# Patient Record
Sex: Female | Born: 1975 | Race: Black or African American | Hispanic: No | Smoking: Current every day smoker
Health system: Southern US, Community
[De-identification: ages and names within clinical notes are randomized; demographics above are authoritative.]

## PROBLEM LIST (undated history)

## (undated) HISTORY — PX: APPENDECTOMY: SHX54

---

## 2002-01-04 ENCOUNTER — Emergency Department (HOSPITAL_COMMUNITY): Admission: EM | Admit: 2002-01-04 | Discharge: 2002-01-04 | Payer: Self-pay | Admitting: Emergency Medicine

## 2002-10-23 ENCOUNTER — Emergency Department (HOSPITAL_COMMUNITY): Admission: EM | Admit: 2002-10-23 | Discharge: 2002-10-23 | Payer: Self-pay | Admitting: Emergency Medicine

## 2002-11-14 ENCOUNTER — Emergency Department (HOSPITAL_COMMUNITY): Admission: EM | Admit: 2002-11-14 | Discharge: 2002-11-14 | Payer: Self-pay | Admitting: Emergency Medicine

## 2002-11-14 ENCOUNTER — Encounter: Payer: Self-pay | Admitting: Emergency Medicine

## 2003-09-16 ENCOUNTER — Emergency Department (HOSPITAL_COMMUNITY): Admission: EM | Admit: 2003-09-16 | Discharge: 2003-09-16 | Payer: Self-pay | Admitting: *Deleted

## 2004-08-28 ENCOUNTER — Emergency Department (HOSPITAL_COMMUNITY): Admission: EM | Admit: 2004-08-28 | Discharge: 2004-08-28 | Payer: Self-pay | Admitting: Emergency Medicine

## 2004-12-06 ENCOUNTER — Emergency Department (HOSPITAL_COMMUNITY): Admission: EM | Admit: 2004-12-06 | Discharge: 2004-12-06 | Payer: Self-pay | Admitting: *Deleted

## 2005-02-04 ENCOUNTER — Emergency Department (HOSPITAL_COMMUNITY): Admission: EM | Admit: 2005-02-04 | Discharge: 2005-02-04 | Payer: Self-pay | Admitting: Emergency Medicine

## 2006-04-06 ENCOUNTER — Emergency Department (HOSPITAL_COMMUNITY): Admission: EM | Admit: 2006-04-06 | Discharge: 2006-04-06 | Payer: Self-pay | Admitting: Emergency Medicine

## 2006-08-05 ENCOUNTER — Emergency Department (HOSPITAL_COMMUNITY): Admission: EM | Admit: 2006-08-05 | Discharge: 2006-08-05 | Payer: Self-pay | Admitting: Emergency Medicine

## 2008-10-11 ENCOUNTER — Inpatient Hospital Stay (HOSPITAL_COMMUNITY): Admission: AD | Admit: 2008-10-11 | Discharge: 2008-10-11 | Payer: Self-pay | Admitting: Family Medicine

## 2008-11-08 ENCOUNTER — Encounter: Payer: Self-pay | Admitting: Obstetrics & Gynecology

## 2008-11-08 ENCOUNTER — Ambulatory Visit: Payer: Self-pay | Admitting: Obstetrics & Gynecology

## 2008-11-09 ENCOUNTER — Encounter: Payer: Self-pay | Admitting: Obstetrics & Gynecology

## 2008-11-22 ENCOUNTER — Ambulatory Visit: Payer: Self-pay | Admitting: Obstetrics & Gynecology

## 2008-12-19 ENCOUNTER — Ambulatory Visit (HOSPITAL_COMMUNITY): Admission: RE | Admit: 2008-12-19 | Discharge: 2008-12-19 | Payer: Self-pay | Admitting: Obstetrics and Gynecology

## 2009-03-16 ENCOUNTER — Emergency Department (HOSPITAL_COMMUNITY): Admission: EM | Admit: 2009-03-16 | Discharge: 2009-03-16 | Payer: Self-pay | Admitting: Emergency Medicine

## 2009-05-08 ENCOUNTER — Emergency Department (HOSPITAL_COMMUNITY): Admission: EM | Admit: 2009-05-08 | Discharge: 2009-05-08 | Payer: Self-pay | Admitting: Emergency Medicine

## 2009-05-12 ENCOUNTER — Emergency Department (HOSPITAL_COMMUNITY): Admission: EM | Admit: 2009-05-12 | Discharge: 2009-05-12 | Payer: Self-pay | Admitting: Emergency Medicine

## 2009-05-12 ENCOUNTER — Emergency Department (HOSPITAL_COMMUNITY): Admission: EM | Admit: 2009-05-12 | Discharge: 2009-05-12 | Payer: Self-pay | Admitting: Family Medicine

## 2009-06-29 ENCOUNTER — Inpatient Hospital Stay (HOSPITAL_COMMUNITY): Admission: EM | Admit: 2009-06-29 | Discharge: 2009-07-01 | Payer: Self-pay | Admitting: Emergency Medicine

## 2009-06-29 ENCOUNTER — Encounter (INDEPENDENT_AMBULATORY_CARE_PROVIDER_SITE_OTHER): Payer: Self-pay | Admitting: General Surgery

## 2009-07-24 ENCOUNTER — Emergency Department (HOSPITAL_COMMUNITY): Admission: EM | Admit: 2009-07-24 | Discharge: 2009-07-25 | Payer: Self-pay | Admitting: Emergency Medicine

## 2010-05-26 ENCOUNTER — Emergency Department (HOSPITAL_COMMUNITY): Admission: EM | Admit: 2010-05-26 | Discharge: 2010-05-26 | Payer: Self-pay | Admitting: Emergency Medicine

## 2010-10-22 ENCOUNTER — Inpatient Hospital Stay (HOSPITAL_COMMUNITY): Admission: AD | Admit: 2010-10-22 | Discharge: 2010-10-22 | Payer: Self-pay | Admitting: Obstetrics & Gynecology

## 2010-11-10 ENCOUNTER — Emergency Department (HOSPITAL_COMMUNITY)
Admission: EM | Admit: 2010-11-10 | Discharge: 2010-11-10 | Payer: Self-pay | Source: Home / Self Care | Admitting: Emergency Medicine

## 2010-12-15 ENCOUNTER — Encounter: Payer: Self-pay | Admitting: *Deleted

## 2011-02-04 LAB — POCT PREGNANCY, URINE: Preg Test, Ur: NEGATIVE

## 2011-02-04 LAB — URINALYSIS, ROUTINE W REFLEX MICROSCOPIC
Glucose, UA: NEGATIVE mg/dL
Hgb urine dipstick: NEGATIVE
Ketones, ur: NEGATIVE mg/dL
pH: 6.5 (ref 5.0–8.0)

## 2011-02-04 LAB — GC/CHLAMYDIA PROBE AMP, GENITAL: Chlamydia, DNA Probe: NEGATIVE

## 2011-02-04 LAB — WET PREP, GENITAL: Yeast Wet Prep HPF POC: NONE SEEN

## 2011-03-01 LAB — POCT I-STAT, CHEM 8
Calcium, Ion: 1.17 mmol/L (ref 1.12–1.32)
Glucose, Bld: 107 mg/dL — ABNORMAL HIGH (ref 70–99)
HCT: 50 % — ABNORMAL HIGH (ref 36.0–46.0)
Hemoglobin: 17 g/dL — ABNORMAL HIGH (ref 12.0–15.0)

## 2011-03-01 LAB — DIFFERENTIAL
Basophils Absolute: 0 10*3/uL (ref 0.0–0.1)
Basophils Absolute: 0 10*3/uL (ref 0.0–0.1)
Basophils Relative: 0 % (ref 0–1)
Basophils Relative: 0 % (ref 0–1)
Eosinophils Absolute: 0.8 K/uL — ABNORMAL HIGH (ref 0.0–0.7)
Eosinophils Relative: 9 % — ABNORMAL HIGH (ref 0–5)
Eosinophils Relative: 0 % (ref 0–5)
Lymphocytes Relative: 36 % (ref 12–46)
Lymphs Abs: 3 10*3/uL (ref 0.7–4.0)
Monocytes Absolute: 0.7 10*3/uL (ref 0.1–1.0)
Monocytes Absolute: 0.4 10*3/uL (ref 0.1–1.0)
Monocytes Relative: 8 % (ref 3–12)
Neutro Abs: 3.9 10*3/uL (ref 1.7–7.7)
Neutro Abs: 11 10*3/uL — ABNORMAL HIGH (ref 1.7–7.7)
Neutrophils Relative %: 47 % (ref 43–77)

## 2011-03-01 LAB — COMPREHENSIVE METABOLIC PANEL WITH GFR
ALT: 19 U/L (ref 0–35)
AST: 37 U/L (ref 0–37)
Alkaline Phosphatase: 91 U/L (ref 39–117)
CO2: 27 meq/L (ref 19–32)
Chloride: 105 meq/L (ref 96–112)
GFR calc non Af Amer: >60 mL/min (ref >60)
Glucose, Bld: 94 mg/dL (ref 70–99)
Potassium: 3.7 meq/L (ref 3.5–5.1)
Sodium: 136 meq/L (ref 135–145)
Total Bilirubin: 0.4 mg/dL (ref 0.3–1.2)

## 2011-03-01 LAB — COMPREHENSIVE METABOLIC PANEL
AST: 54 U/L — ABNORMAL HIGH (ref 0–37)
Albumin: 3.8 g/dL (ref 3.5–5.2)
Albumin: 4.1 g/dL (ref 3.5–5.2)
Alkaline Phosphatase: 83 U/L (ref 39–117)
BUN: 6 mg/dL (ref 6–23)
BUN: 5 mg/dL — ABNORMAL LOW (ref 6–23)
Calcium: 8.9 mg/dL (ref 8.4–10.5)
Chloride: 103 mEq/L (ref 96–112)
Creatinine, Ser: 0.73 mg/dL (ref 0.4–1.2)
GFR calc Af Amer: >60 mL/min (ref >60)
Potassium: 3.8 mEq/L (ref 3.5–5.1)
Total Bilirubin: 0.6 mg/dL (ref 0.3–1.2)
Total Protein: 7.5 g/dL (ref 6.0–8.3)

## 2011-03-01 LAB — CBC
HCT: 42.5 % (ref 36.0–46.0)
HCT: 46.5 % — ABNORMAL HIGH (ref 36.0–46.0)
Hemoglobin: 14.5 g/dL (ref 12.0–15.0)
MCHC: 34.1 g/dL (ref 30.0–36.0)
MCV: 91.2 fL (ref 78.0–100.0)
Platelets: 184 10*3/uL (ref 150–400)
Platelets: 182 10*3/uL (ref 150–400)
RBC: 4.66 MIL/uL (ref 3.87–5.11)
RBC: 5.06 MIL/uL (ref 3.87–5.11)
RDW: 12.7 % (ref 11.5–15.5)
WBC: 8.4 K/uL (ref 4.0–10.5)
WBC: 12.3 10*3/uL — ABNORMAL HIGH (ref 4.0–10.5)

## 2011-03-01 LAB — URINALYSIS, ROUTINE W REFLEX MICROSCOPIC
Bilirubin Urine: NEGATIVE
Bilirubin Urine: NEGATIVE
Bilirubin Urine: NEGATIVE
Glucose, UA: NEGATIVE mg/dL
Hgb urine dipstick: NEGATIVE
Ketones, ur: NEGATIVE mg/dL
Ketones, ur: 15 mg/dL — AB
Nitrite: NEGATIVE
Nitrite: NEGATIVE
Nitrite: NEGATIVE
Protein, ur: NEGATIVE mg/dL
Specific Gravity, Urine: 1.025 (ref 1.005–1.030)
Specific Gravity, Urine: 1.01 (ref 1.005–1.030)
Urobilinogen, UA: 0.2 mg/dL (ref 0.0–1.0)
Urobilinogen, UA: 0.2 mg/dL (ref 0.0–1.0)
pH: 6 (ref 5.0–8.0)
pH: 7.5 (ref 5.0–8.0)

## 2011-03-01 LAB — CULTURE, BLOOD (ROUTINE X 2)

## 2011-03-01 LAB — URINE CULTURE

## 2011-03-01 LAB — LIPASE, BLOOD: Lipase: 26 U/L (ref 11–59)

## 2011-03-01 LAB — PREGNANCY, URINE: Preg Test, Ur: NEGATIVE

## 2011-03-05 LAB — COMPREHENSIVE METABOLIC PANEL
ALT: 20 U/L (ref 0–35)
AST: 38 U/L — ABNORMAL HIGH (ref 0–37)
Alkaline Phosphatase: 76 U/L (ref 39–117)
CO2: 29 mEq/L (ref 19–32)
Calcium: 9.3 mg/dL (ref 8.4–10.5)
Chloride: 105 mEq/L (ref 96–112)
GFR calc non Af Amer: >60 mL/min (ref >60)
Glucose, Bld: 105 mg/dL — ABNORMAL HIGH (ref 70–99)
Potassium: 4.1 mEq/L (ref 3.5–5.1)
Sodium: 139 mEq/L (ref 135–145)
Total Bilirubin: 0.5 mg/dL (ref 0.3–1.2)

## 2011-03-05 LAB — DIFFERENTIAL
Basophils Absolute: 0.1 10*3/uL (ref 0.0–0.1)
Basophils Relative: 1 % (ref 0–1)
Eosinophils Absolute: 0.1 10*3/uL (ref 0.0–0.7)
Eosinophils Relative: 2 % (ref 0–5)
Neutrophils Relative %: 65 % (ref 43–77)

## 2011-03-05 LAB — URINALYSIS, ROUTINE W REFLEX MICROSCOPIC
Bilirubin Urine: NEGATIVE
Glucose, UA: NEGATIVE mg/dL
Hgb urine dipstick: NEGATIVE
Protein, ur: NEGATIVE mg/dL
Urobilinogen, UA: 0.2 mg/dL (ref 0.0–1.0)

## 2011-03-05 LAB — LIPASE, BLOOD: Lipase: 20 U/L (ref 11–59)

## 2011-03-05 LAB — CBC
Hemoglobin: 15.8 g/dL — ABNORMAL HIGH (ref 12.0–15.0)
MCHC: 34.6 g/dL (ref 30.0–36.0)
RBC: 5.02 MIL/uL (ref 3.87–5.11)
WBC: 8.8 10*3/uL (ref 4.0–10.5)

## 2011-04-08 NOTE — Discharge Summary (Signed)
NAMEALEXEA, BLASE NO.:  [id number]   MEDICAL RECORD NO.:  [id number]          PATIENT TYPE:  INP   LOCATION:  5121                         FACILITY:  [facility]   PHYSICIAN:  Gaynelle Adu, MD        DATE OF BIRTH:  27-Aug-1976   DATE OF ADMISSION:  06/28/2009  DATE OF DISCHARGE:  07/01/2009                               DISCHARGE SUMMARY   DISCHARGING PHYSICIAN:  Gaynelle Adu, MD   ADMITTING AND OPERATING PHYSICIAN:  Juanetta Gosling, MD   HISTORY OF PRESENT ILLNESS:  Ms. Jeffrey is a pleasant 35 year old female  who was brought in with pain in periumbilical region with some nausea  and vomiting as well as loose stools.  She was evaluated in the  emergency department by Dr. Dwain Sarna and found to have an elevated  white blood cell count of 12.3 with a left shift.  She had a CT scan  that shows dilatation of her appendix with some periappendiceal  stranding.  Decision was made at that point by Dr. Dwain Sarna to proceed  with admission and surgical intervention.   SUMMARY OF HOSPITAL COURSE:  The patient was admitted on June 29, 2009,  was taken to the operating room same evening, underwent laparoscopic  appendectomy without complication.  She tolerated this procedure very  well.  She was admitted to the floor in stable condition.  Postoperatively, the patient had essentially normal course without  postoperative complication including fever, chest pain, shortness of  breath, wound infection, or DVT.  She did not have any repeat labs and  as of July 01, 2009, the patient was afebrile, passing gas, eating  well, and complaining of minimal pain.  Decision at that point was made  to discharge the patient as she was stable.   DISCHARGE DIAGNOSIS:  Acute appendicitis, status post laparoscopic  appendectomy.   PLAN:  The patient will follow up as directed in the office for recheck.  She will be given a prescription for narcotic analgesic to use as  directed in  addition to p.r.n. Tylenol or ibuprofen.   ACTIVITY:  Work restrictions that had been given to her and call our  office with any significant complaints.      Brayton El, PA-C      Gaynelle Adu, MD  Electronically Signed    KB/MEDQ  D:  07/11/2009  T:  07/12/2009  Job:  [id number]

## 2011-04-08 NOTE — Op Note (Signed)
[name], [name] NO.:  [id number]   MEDICAL RECORD NO.:  [id number]          PATIENT TYPE:  INP   LOCATION:  5121                         FACILITY:  [facility]   PHYSICIAN:  Juanetta Gosling, MDDATE OF BIRTH:  [birth year]   DATE OF PROCEDURE:  06/29/2009  DATE OF DISCHARGE:                               OPERATIVE REPORT   PREOPERATIVE DIAGNOSIS:  Appendicitis.   POSTOPERATIVE DIAGNOSIS:  Acute appendicitis.   PROCEDURE:  Laparoscopic appendectomy.   SURGEON:  Juanetta Gosling, MD   ASSISTANT:  None.   ANESTHESIA:  General.   SUPERVISING ANESTHESIOLOGIST:  Burna Forts, MD   SPECIMEN:  Appendix to Pathology.   ESTIMATED BLOOD LOSS:  75 mL.   COMPLICATIONS:  None.   DRAINS:  None.   DISPOSITION:  To recovery room in stable condition.   INDICATIONS:  [name] is a 35 year old female with about 24 hours of  right lower quadrant periumbilical suprapubic pain that has worsened  over that time.  Her white blood cell count was elevated at 12.3 with a  left shift.  She underwent a CT scan that showed dilated appendix with  some periappendiceal stranding.  I counseled her for a laparoscopic  possible open appendectomy.   PROCEDURE:  After informed consent was obtained, the patient was first  administered 400 mg of IV ciprofloxacin and 500 mg of IV metronidazole  due to her penicillin allergy.  She was then brought to the operating  room and sequential compression devices were placed on lower extremities  prior to operation.  She then underwent general endotracheal anesthesia  without complication.  Her arms were then tucked and appropriately  padded.  Her abdomen was prepped with ChloraPrep, 3 minutes were allowed  to pass.  She was then draped in a standard sterile surgical fashion.  Surgical time-out was then performed.   A 10-mm vertical incision was then made below her umbilicus and  dissection was carried out down to the level of the  fascia.  A Kocher  clamp was used to grasp the base of the umbilicus and the fascia was  entered sharply.  The peritoneum was then entered bluntly.  A 0-Vicryl  pursestring suture was then placed through the fascia.  A Hasson trocar  was then introduced into the abdomen and the abdomen was insufflated to  15 mmHg.  Two further 5-mm ports were placed under direct vision after  infiltration of local anesthetic without complication in the suprapubic  region as well as in the right upper quadrant.  She was then placed in  the Trendelenburg position and the appendix was approached. There were  some adhesions from her omentum to her right lower quadrant that were  taken down bluntly.  These were not bleeding upon completion.  Her  appendix was then noted to have acute suppurative appendicitis that was  thick and inflamed at the distal two thirds, this was then grasped, the  base was identified and encircled with a Art gallery manager.  Following  this, I used the GIA stapler with a white load to divide the mesentery.  The mesentery was thickened and there was pulsatile bleeding from this  edge upon completion of this.  I was able to identify this and I placed  three 10-mm clips on this base of the mesentery following that.  This  was hemostatic upon completion.  I then used a blue load and divided the  appendix at the base of the cecum.  This was looked good upon  completion.  The appendix was then placed in an EndoCatch and then this  was removed.  Following this, I reinspected the site where I had the  bleeding from the mesentery and this appeared clean.  Irrigation was  performed.  This was cleared.  I then tied down my umbilical suture  after removing the trocar and this appeared to be a good closure.  I did  although place an additional 0-Vicryl stitch through the fascia.  This  appeared to be well closed upon completion.  I then removed the camera,  desufflated the abdomen, and removed the  remaining trocars.  The wound  were then closed with a 4-0 Monocryl in a subcuticular fashion.  Dermabond was placed over these wounds.  She tolerated this well.  Foley  was removed in the operating room, she was extubated and transferred to  recovery room in stable condition.      Juanetta Gosling, MD  Electronically Signed     MCW/MEDQ  D:  06/29/2009  T:  06/29/2009  Job:  [id number]

## 2011-04-08 NOTE — H&P (Signed)
[name], [name] NO.:  [id number]   MEDICAL RECORD NO.:  [id number]          PATIENT TYPE:  INP   LOCATION:  5121                         FACILITY:  [facility]   PHYSICIAN:  Juanetta Gosling, MDDATE OF BIRTH:  02/27/1976   DATE OF ADMISSION:  06/28/2009  DATE OF DISCHARGE:                              HISTORY & PHYSICAL   CHIEF COMPLAINT:  Abdominal pain, nausea, vomiting, and loose stools.   HISTORY:  This is a 35 year old female, who is otherwise fairly healthy,  who right about in 24 hours prior to my evaluation of her, began having  abdominal pain in her periumbilical region associated with some bilious  emesis as well as a couple of loose stools.  These were nonbloody.  This  pain progressed during the day yesterday resulting in her coming to the  emergency room.  She denies any fevers at home.  The pain is primarily  located in the periumbilical right lower quadrant suprapubic region and  she says that this is worse with moving.  It persists despite the pain  medication she has received in the emergency room.  She has had no  further nausea, vomiting, or any loose stools since she has arrived in  the emergency room.  She denies any fevers associated with this and  denies any prior history of this.  She does have over the last year or  so, some heartburn that she has been treated for and has had some  bloating, but this is different than those more chronic symptoms.   PAST MEDICAL HISTORY:  Significant for reactive airway disease.   PAST SURGICAL HISTORY:  Positive for oral surgery.   SOCIAL HISTORY:  She takes occasional alcohol and does smoke.   DRUG ALLERGIES:  PENICILLIN.   MEDICATIONS:  Albuterol p.r.n., she has only used 3 times this year.   REVIEW OF SYSTEMS:  Otherwise negative.   PHYSICAL EXAMINATION:  VITAL SIGNS:  Temperature 98.7, pulse 77,  respirations 24, and blood pressure 147/89.  GENERAL:  She is an obese female in no apparent  distress.  NECK:  Without lymphadenopathy.  Sclerae anicteric.  HEART:  Regular rate and rhythm.  LUNGS:  Clear bilaterally.  ABDOMEN:  Obese.  She is tender to palpation in the periumbilical right  lower quadrant suprapubic region without any real peritoneal signs.  Bowel sounds are present.  EXTREMITIES:  No edema.   LABORATORY EVALUATION:  UA negative for infection and hCG that is  negative.  Lipase 21.  Her liver function tests are within normal  limits.  Sodium is 137.  Potassium 3.8, BUN 5, creatinine 0.77, and  glucose 111.  White blood cell count is 12.3 with a left shift and 89  segs.  Hematocrit 46.5 and platelets 182.  She has a CT scan that shows  mild dilation of her appendix with some periappendiceal stranding.  No  evidence of perforation and a left adnexal cyst that will need to be  followed with an ultrasound.   IMPRESSION:  Appendicitis.   PLAN OF ACTION:  1. IV fluids.  2. Cipro,  Flagyl due to her penicillin allergy.  3. N.p.o.  4. I discussed a laparoscopic possible open appendectomy with her and      I would like to do this tonight.  She is tearful upon me telling      her that we discussed the risks and indications of this procedure      as she has stated that she is going to talk to her mother before we      proceed with the operation.  I will wait her decision to proceed      with this operation.  5. She will need a followup ultrasound for left adnexal cyst at some      point in the future as well.      Juanetta Gosling, MD  Electronically Signed     MCW/MEDQ  D:  06/29/2009  T:  06/29/2009  Job:  [id number]   cc:   Kennon Rounds, MD

## 2011-04-08 NOTE — Group Therapy Note (Signed)
NAMEMINDEL, FRISCIA NO.:  [id number]   MEDICAL RECORD NO.:  [id number]          PATIENT TYPE:  WOC   LOCATION:  [facility]                   FACILITY:  [facility]   PHYSICIAN:  Allie Bossier, MD        DATE OF BIRTH:  10-Aug-1976   DATE OF SERVICE:                                  CLINIC NOTE   [name] is a 35 year old gravida 0 who was seen in the MAU on November 18  for vaginal bleeding.  At that time her hemoglobin was 14.8 and she had  an ultrasound that showed a complex left ovarian cyst measuring about4 x  4.8 cm.  The endometrium was a normal thickness 4.4 mm, and otherwise appeared  normal.  She was told to follow up here.  She was given a prescription  of 10 mg of Provera to take for 14 days.  Today her complaint is that of  left lower quadrant pain.  She takes Motrin for it as necessary, and  some pelvic pressure, like she has to void (urinary frequency).  A urine  culture will be done.   PHYSICAL EXAM:  Cervix is nulliparous.  Her abdominal exam is  noncontributory because of her voluntary guarding.  Pap smear was  obtained.  To treat her ovarian cyst and pelvic pain, I will put her on  Low Ovral.  She will come back in 3 weeks for a blood pressure check.  An ultrasound will be scheduled in 6 weeks after initiation of birth  control pills to check the status of her ovarian cyst.      Allie Bossier, MD     MCD/MEDQ  D:  11/08/2008  T:  11/09/2008  Job:  [id number]

## 2011-07-21 ENCOUNTER — Emergency Department (HOSPITAL_COMMUNITY)
Admission: EM | Admit: 2011-07-21 | Discharge: 2011-07-21 | Disposition: A | Discharge status: Home / Self Care | Payer: Self-pay | Attending: Emergency Medicine | Admitting: Emergency Medicine

## 2011-07-21 DIAGNOSIS — IMO0002 Reserved for concepts with insufficient information to code with codable children: Secondary | ICD-10-CM | POA: Insufficient documentation

## 2011-07-23 ENCOUNTER — Emergency Department (HOSPITAL_COMMUNITY)
Admission: EM | Admit: 2011-07-23 | Discharge: 2011-07-23 | Disposition: A | Discharge status: Home / Self Care | Payer: Self-pay | Attending: Emergency Medicine | Admitting: Emergency Medicine

## 2011-07-23 DIAGNOSIS — Z09 Encounter for follow-up examination after completed treatment for conditions other than malignant neoplasm: Secondary | ICD-10-CM | POA: Insufficient documentation

## 2011-07-23 DIAGNOSIS — IMO0002 Reserved for concepts with insufficient information to code with codable children: Secondary | ICD-10-CM | POA: Insufficient documentation

## 2011-08-27 LAB — CBC
HCT: 43
Hemoglobin: 14.8
MCV: 92.3
Platelets: 218
RDW: 12.4

## 2011-08-27 LAB — WET PREP, GENITAL
Clue Cells Wet Prep HPF POC: NONE SEEN
WBC, Wet Prep HPF POC: NONE SEEN

## 2011-08-27 LAB — POCT PREGNANCY, URINE: Preg Test, Ur: NEGATIVE

## 2011-08-27 LAB — GC/CHLAMYDIA PROBE AMP, GENITAL: Chlamydia, DNA Probe: NEGATIVE

## 2011-08-29 LAB — POCT URINALYSIS DIP (DEVICE)
Bilirubin Urine: NEGATIVE
Glucose, UA: NEGATIVE mg/dL
Nitrite: NEGATIVE
Specific Gravity, Urine: 1.025 (ref 1.005–1.030)
Urobilinogen, UA: 1 mg/dL (ref 0.0–1.0)

## 2012-01-31 ENCOUNTER — Emergency Department (HOSPITAL_COMMUNITY)
Admission: EM | Admit: 2012-01-31 | Discharge: 2012-01-31 | Disposition: A | Discharge status: Home / Self Care | Payer: Self-pay | Attending: Emergency Medicine | Admitting: Emergency Medicine

## 2012-01-31 ENCOUNTER — Encounter (HOSPITAL_COMMUNITY): Payer: Self-pay | Admitting: Emergency Medicine

## 2012-01-31 ENCOUNTER — Emergency Department (HOSPITAL_COMMUNITY): Payer: Self-pay

## 2012-01-31 DIAGNOSIS — R059 Cough, unspecified: Secondary | ICD-10-CM | POA: Insufficient documentation

## 2012-01-31 DIAGNOSIS — R221 Localized swelling, mass and lump, neck: Secondary | ICD-10-CM | POA: Insufficient documentation

## 2012-01-31 DIAGNOSIS — R070 Pain in throat: Secondary | ICD-10-CM | POA: Insufficient documentation

## 2012-01-31 DIAGNOSIS — J111 Influenza due to unidentified influenza virus with other respiratory manifestations: Secondary | ICD-10-CM | POA: Insufficient documentation

## 2012-01-31 DIAGNOSIS — R51 Headache: Secondary | ICD-10-CM | POA: Insufficient documentation

## 2012-01-31 DIAGNOSIS — F17200 Nicotine dependence, unspecified, uncomplicated: Secondary | ICD-10-CM | POA: Insufficient documentation

## 2012-01-31 DIAGNOSIS — R0602 Shortness of breath: Secondary | ICD-10-CM | POA: Insufficient documentation

## 2012-01-31 DIAGNOSIS — J45909 Unspecified asthma, uncomplicated: Secondary | ICD-10-CM | POA: Insufficient documentation

## 2012-01-31 DIAGNOSIS — IMO0001 Reserved for inherently not codable concepts without codable children: Secondary | ICD-10-CM | POA: Insufficient documentation

## 2012-01-31 DIAGNOSIS — R509 Fever, unspecified: Secondary | ICD-10-CM | POA: Insufficient documentation

## 2012-01-31 DIAGNOSIS — R220 Localized swelling, mass and lump, head: Secondary | ICD-10-CM | POA: Insufficient documentation

## 2012-01-31 DIAGNOSIS — R05 Cough: Secondary | ICD-10-CM | POA: Insufficient documentation

## 2012-01-31 MED ORDER — OSELTAMIVIR PHOSPHATE 75 MG PO CAPS: 75.0000 mg | ORAL_CAPSULE | Freq: Two times a day (BID) | ORAL | Status: AC

## 2012-01-31 MED ORDER — IPRATROPIUM BROMIDE 0.02 % IN SOLN
0.5000 mg | Freq: Once | RESPIRATORY_TRACT | Status: AC
  Administered 2012-01-31: 0.5 mg via RESPIRATORY_TRACT
  Filled 2012-01-31: qty 2.5

## 2012-01-31 MED ORDER — ALBUTEROL SULFATE (5 MG/ML) 0.5% IN NEBU
5.0000 mg | INHALATION_SOLUTION | Freq: Once | RESPIRATORY_TRACT | Status: AC
  Administered 2012-01-31: 5 mg via RESPIRATORY_TRACT
  Filled 2012-01-31: qty 1

## 2012-01-31 MED ORDER — PREDNISONE 20 MG PO TABS: 40.0000 mg | ORAL_TABLET | Freq: Every day | ORAL | Status: AC

## 2012-01-31 MED ORDER — ACETAMINOPHEN 325 MG PO TABS
650.0000 mg | ORAL_TABLET | Freq: Once | ORAL | Status: AC
  Administered 2012-01-31: 650 mg via ORAL
  Filled 2012-01-31: qty 2

## 2012-01-31 MED ORDER — PREDNISONE 20 MG PO TABS
60.0000 mg | ORAL_TABLET | Freq: Once | ORAL | Status: AC
  Administered 2012-01-31: 60 mg via ORAL
  Filled 2012-01-31: qty 3

## 2012-01-31 MED ORDER — GI COCKTAIL ~~LOC~~
30.0000 mL | Freq: Once | ORAL | Status: AC
  Administered 2012-01-31: 30 mL via ORAL
  Filled 2012-01-31: qty 30

## 2012-01-31 NOTE — ED Notes (Signed)
PT. REPORTS SOB WITH PRODUCTIVE COUGH, HEADACHE AND CHILLS FOR 4 DAYS UNRELIEVED BY OTC MEDICATIONS.

## 2012-01-31 NOTE — ED Provider Notes (Signed)
History     CSN: [id number]  Arrival date & time 01/31/12  5621   First MD Initiated Contact with Patient 01/31/12 1933      Chief Complaint  Patient presents with  . Shortness of Breath    (Consider location/radiation/quality/duration/timing/severity/associated sxs/prior treatment) Patient is a 36 y.o. female presenting with URI. The history is provided by the patient.  URI The primary symptoms include fever, headaches, sore throat, cough, wheezing and myalgias. Primary symptoms do not include nausea or vomiting. The current episode started 3 to 5 days ago. This is a new problem. The problem has not changed since onset. The fever began 3 to 5 days ago. The fever has been unchanged since its onset. The maximum temperature recorded prior to her arrival was unknown.  The cough began 3 to 5 days ago. The cough is non-productive.  The onset of the illness is associated with exposure to sick contacts. Symptoms associated with the illness include chills, sinus pressure, congestion and rhinorrhea. The following treatments were addressed: Acetaminophen was ineffective. A decongestant was ineffective. NSAIDs were ineffective.    Past Medical History  Diagnosis Date  . Asthma     Past Surgical History  Procedure Date  . Appendectomy     No family history on file.  History  Substance Use Topics  . Smoking status: Current Everyday Smoker  . Smokeless tobacco: Not on file  . Alcohol Use: Yes    OB History    Grav Para Term Preterm Abortions TAB SAB Ect Mult Living                  Review of Systems  Constitutional: Positive for fever and chills.  HENT: Positive for congestion, sore throat, rhinorrhea and sinus pressure.   Respiratory: Positive for cough and wheezing.   Gastrointestinal: Negative for nausea and vomiting.  Musculoskeletal: Positive for myalgias.  Neurological: Positive for headaches.  All other systems reviewed and are negative.    Allergies   Penicillins  Home Medications   Current Outpatient Rx  Name Route Sig Dispense Refill  . ALBUTEROL SULFATE HFA 108 (90 BASE) MCG/ACT IN AERS Inhalation Inhale 2 puffs into the lungs every 6 (six) hours as needed. For wheezing    . ALBUTEROL SULFATE (2.5 MG/3ML) 0.083% IN NEBU Nebulization Take 2.5 mg by nebulization every 6 (six) hours as needed. For wheezing    . GUAIFENESIN ER 1200 MG PO TB12 Oral Take 1 tablet by mouth daily as needed. For chest congestion    . THERAFLU COLD & COUGH PO Oral Take 1 packet by mouth at bedtime.    . NYQUIL PO Oral Take 10 mLs by mouth 2 (two) times daily.      BP 139/78  Pulse 119  Temp(Src) 102.9 F (39.4 C) (Oral)  Resp 20  SpO2 98%  LMP 01/20/2012  Physical Exam  Nursing note and vitals reviewed. Constitutional: She is oriented to person, place, and time. She appears well-developed and well-nourished. No distress.  HENT:  Head: Normocephalic and atraumatic.  Right Ear: Tympanic membrane and ear canal normal.  Left Ear: Tympanic membrane and ear canal normal.  Nose: Mucosal edema and rhinorrhea present.  Mouth/Throat: Mucous membranes are normal. Posterior oropharyngeal erythema present. No oropharyngeal exudate or posterior oropharyngeal edema.  Eyes: EOM are normal. Pupils are equal, round, and reactive to light.  Neck: Neck supple. No Brudzinski's sign and no Kernig's sign noted.  Cardiovascular: Normal rate, regular rhythm, normal heart sounds and intact distal pulses.  Exam reveals no friction rub.   No murmur heard. Pulmonary/Chest: Effort normal and breath sounds normal. No respiratory distress. She has no wheezes. She has no rales.  Abdominal: Soft. Bowel sounds are normal. She exhibits no distension. There is no tenderness. There is no rebound and no guarding.  Musculoskeletal: Normal range of motion. She exhibits no tenderness.       No edema  Lymphadenopathy:    She has no cervical adenopathy.  Neurological: She is alert and  oriented to person, place, and time. No cranial nerve deficit.  Skin: Skin is warm and dry. No rash noted.  Psychiatric: She has a normal mood and affect. Her behavior is normal.    ED Course  Procedures (including critical care time)  Labs Reviewed - No data to display Dg Chest 2 View  01/31/2012  *RADIOLOGY REPORT*  Clinical Data: Smoker with cough, pharyngitis, shortness of breath and chest congestion.  CHEST - 2 VIEW  Comparison: None.  Findings: Poor inspiration.  Grossly normal sized heart.  Clear lungs.  Minimal diffuse peribronchial thickening.  Unremarkable bones.  IMPRESSION: Minimal bronchitic changes.  Original Report Authenticated By: Darrol Angel, M.D.     No diagnosis found.    MDM   Pt with symptoms consistent with viral bronchitis.  Patient has been using her albuterol and nebs all week and states it's not helping. Fever of 102.9 here and tachycardic.  No signs of breathing difficulty  No signs of pharyngitis, otitis or abnormal abdominal findings.  Satting well on exam. CXR bronchitic changes but no focal signs. Patient may have influenza. Fever controlled and patient given nebs and steroids.  9:13 PM On reevaluation the patient still having sore throat and chest tightness. Will try GI cocktail to help with sore throat and continue to observe  11:14 PM Feeling better will d/c home.      Gwyneth Sprout, MD 01/31/12 2314

## 2012-01-31 NOTE — ED Notes (Signed)
Patient with upper respiratory symptoms since Monday, febrile today.  Patient does have slight cough.

## 2014-01-17 ENCOUNTER — Encounter (HOSPITAL_COMMUNITY): Payer: Self-pay | Admitting: Emergency Medicine

## 2014-01-17 DIAGNOSIS — Z880 Allergy status to penicillin: Secondary | ICD-10-CM | POA: Insufficient documentation

## 2014-01-17 DIAGNOSIS — J45909 Unspecified asthma, uncomplicated: Secondary | ICD-10-CM | POA: Insufficient documentation

## 2014-01-17 DIAGNOSIS — Z79899 Other long term (current) drug therapy: Secondary | ICD-10-CM | POA: Insufficient documentation

## 2014-01-17 DIAGNOSIS — F17200 Nicotine dependence, unspecified, uncomplicated: Secondary | ICD-10-CM | POA: Insufficient documentation

## 2014-01-17 DIAGNOSIS — H6690 Otitis media, unspecified, unspecified ear: Secondary | ICD-10-CM | POA: Insufficient documentation

## 2014-01-17 NOTE — ED Notes (Signed)
Patient presents stating she cannot hear out of her right ear.  States she tried some over the counter drops but nothing is helping.  Feels like she is talking in a tunnel.  +congestion

## 2014-01-18 ENCOUNTER — Emergency Department (HOSPITAL_COMMUNITY)
Admission: EM | Admit: 2014-01-18 | Discharge: 2014-01-18 | Disposition: A | Discharge status: Home / Self Care | Payer: Self-pay | Attending: Emergency Medicine | Admitting: Emergency Medicine

## 2014-01-18 DIAGNOSIS — H6691 Otitis media, unspecified, right ear: Secondary | ICD-10-CM

## 2014-01-18 MED ORDER — ANTIPYRINE-BENZOCAINE 5.4-1.4 % OT SOLN: 3.0000 [drp] | OTIC | Status: DC | PRN

## 2014-01-18 MED ORDER — AZITHROMYCIN 250 MG PO TABS: 250.0000 mg | ORAL_TABLET | Freq: Every day | ORAL | Status: DC

## 2014-01-18 NOTE — ED Provider Notes (Signed)
Medical screening examination/treatment/procedure(s) were performed by non-physician practitioner and as supervising physician I was immediately available for consultation/collaboration.    Dezra Mandella, MD 01/18/14 0344 

## 2014-01-18 NOTE — ED Provider Notes (Signed)
CSN: [id number]     Arrival date & time 01/17/14  2347 History   First MD Initiated Contact with Patient 01/18/14 0006     Chief Complaint  Patient presents with  . Otalgia     (Consider location/radiation/quality/duration/timing/severity/associated sxs/prior Treatment) HPI Pt is a 38yo female c/o gradually worsening right ear pain associated with decreased hearing that started 2 days ago. Pt states pain is aching and throbbing, 10/10. Has tried OTC ear drops w/o relief. States it feels like something is clogged in there and the drops made it worse. Does report recent congestion. Denies recent cough. Denies fever, n/v/d. Denies sore throat. No known sick contacts or recent travel. No trauma to ear.  Has not tried OTC pain medication.   Past Medical History  Diagnosis Date  . Asthma    Past Surgical History  Procedure Laterality Date  . Appendectomy     History reviewed. No pertinent family history. History  Substance Use Topics  . Smoking status: Current Every Day Smoker  . Smokeless tobacco: Not on file  . Alcohol Use: No   OB History   Grav Para Term Preterm Abortions TAB SAB Ect Mult Living                 Review of Systems  HENT: Positive for ear pain ( right). Negative for congestion and ear discharge.   Respiratory: Negative for cough and shortness of breath.   Gastrointestinal: Negative for nausea, vomiting and diarrhea.  All other systems reviewed and are negative.      Allergies  Penicillins  Home Medications   Current Outpatient Rx  Name  Route  Sig  Dispense  Refill  . albuterol (PROVENTIL HFA;VENTOLIN HFA) 108 (90 BASE) MCG/ACT inhaler   Inhalation   Inhale 2 puffs into the lungs every 6 (six) hours as needed. For wheezing         . albuterol (PROVENTIL) (2.5 MG/3ML) 0.083% nebulizer solution   Nebulization   Take 2.5 mg by nebulization every 6 (six) hours as needed. For wheezing         . antipyrine-benzocaine (AURALGAN) otic solution  Right Ear   Place 3-4 drops into the right ear every 2 (two) hours as needed for ear pain.   10 mL   0   . azithromycin (ZITHROMAX) 250 MG tablet   Oral   Take 1 tablet (250 mg total) by mouth daily. Take first 2 tablets together, then 1 every day until finished.   6 tablet   0   . Guaifenesin (MUCINEX MAXIMUM STRENGTH) 1200 MG TB12   Oral   Take 1 tablet by mouth daily as needed. For chest congestion         . Phenylephrine-Pheniramine-DM (THERAFLU COLD & COUGH PO)   Oral   Take 1 packet by mouth at bedtime.         . Pseudoeph-Doxylamine-DM-APAP (NYQUIL PO)   Oral   Take 10 mLs by mouth 2 (two) times daily.          BP 149/92  Pulse 87  Temp(Src) 98 F (36.7 C) (Oral)  Resp 18  Ht 5\' 4"  (1.626 m)  Wt 200 lb (90.719 kg)  BMI 34.31 kg/m2  SpO2 96%  LMP 12/17/2013 Physical Exam  Nursing note and vitals reviewed. Constitutional: She appears well-developed and well-nourished. No distress.  Pt sitting in exam chair, holding right ear. Appears uncomfortable.  HENT:  Head: Normocephalic and atraumatic.  Right Ear: External ear and ear canal  normal. No drainage. No mastoid tenderness. Tympanic membrane is erythematous and bulging. Tympanic membrane is not injected and not perforated. Decreased hearing is noted.  Left Ear: Hearing, tympanic membrane, external ear and ear canal normal.  Nose: Mucosal edema present.  Mouth/Throat: Uvula is midline, oropharynx is clear and moist and mucous membranes are normal. No oropharyngeal exudate, posterior oropharyngeal edema, posterior oropharyngeal erythema or tonsillar abscesses.  Right Ear: canal clear, erythematous, TM-erythematous and bulging. No perforation or discharge. No mastoid erythema or tenderness.  Eyes: Conjunctivae are normal. No scleral icterus.  Neck: Normal range of motion.  Cardiovascular: Normal rate, regular rhythm and normal heart sounds.   Pulmonary/Chest: Effort normal and breath sounds normal. No  respiratory distress. She has no wheezes. She has no rales. She exhibits no tenderness.  No respiratory distress, able to speak in full sentences w/o difficulty. Lungs: CTAB.  Abdominal: Soft. Bowel sounds are normal. She exhibits no distension and no mass. There is no tenderness. There is no rebound and no guarding.  Musculoskeletal: Normal range of motion.  Neurological: She is alert.  Skin: Skin is warm and dry. She is not diaphoretic.    ED Course  Procedures (including critical care time) Labs Review Labs Reviewed - No data to display Imaging Review No results found.  EKG Interpretation   None       MDM   Final diagnoses:  Otitis media, right    pt presenting with right ear pain and decreased hearing. Denies recent illness.  Pain not relieved by OTC ear drops. Denies fever, n/v/d. On exam, pt appears uncomfortable holding right ear. Right ear: canal-clear, no cerumen impaction.  TM-erythematous and bulging. No perforation or discharge. No mastoid erythema or tenderness. Not concerned for mastoiditis. Oropharynx clear. No tonsillar abscess. Lungs: CTAB.  Will tx for otitis media. Pt is allergic to PCN. Will tx with azithromycin and auralgan otic drops. Advised to use Ibuprofen and acetaminophen as needed for pain. F/u with PCP in 2-3 days or with ENT, Dr. Lazarus SalinesWolicki if hearing not improving. Return precautions provided. Pt verbalized understanding and agreement with tx plan.     Junius FinnerErin O'Malley, PA-C 01/18/14 0102

## 2014-01-18 NOTE — Discharge Instructions (Signed)
Ear Drops, Adult °You need to put eardrops in your ear. °HOME CARE  °· Put drops in your affected ear as told. °· After putting in the drops, lay down with the ear you put the drops in facing up. Do this for 10 minutes. Use the ear drops as long as your doctor tells you. °· Before you get up, put a cotton ball gently in your ear. Do not push it far in your ear. °· Do not wash out your ears unless your doctor says it is okay. °· Finish all medicines as told by your doctor. You may be told to keep using the eardrops even if you start to feel better. °· See your doctor as told for follow-up visits. °GET HELP IF: °· You have pain that gets worse. °· Any unusual fluid (drainage) is coming from your ear (especially if the fluid stinks). °· You have trouble hearing. °· You get really dizzy as if the room is spinning and feel sick to your stomach (vertigo). °· The outside of your ear becomes red or puffy or both. This may be a sign of an allergic reaction. °MAKE SURE YOU:  °· Understand these instructions. °· Will watch your condition. °· Will get help right away if you are not doing well or get worse. °Document Released: 04/30/2010 Document Revised: 07/13/2013 Document Reviewed: 06/07/2013 °[employer]® Patient Information ©2014 [employer]. ° °

## 2016-11-21 ENCOUNTER — Encounter (HOSPITAL_COMMUNITY): Payer: Self-pay | Admitting: Emergency Medicine

## 2016-11-21 DIAGNOSIS — Y999 Unspecified external cause status: Secondary | ICD-10-CM | POA: Insufficient documentation

## 2016-11-21 DIAGNOSIS — F17200 Nicotine dependence, unspecified, uncomplicated: Secondary | ICD-10-CM | POA: Insufficient documentation

## 2016-11-21 DIAGNOSIS — M545 Low back pain: Secondary | ICD-10-CM | POA: Insufficient documentation

## 2016-11-21 DIAGNOSIS — Y9389 Activity, other specified: Secondary | ICD-10-CM | POA: Insufficient documentation

## 2016-11-21 DIAGNOSIS — X501XXA Overexertion from prolonged static or awkward postures, initial encounter: Secondary | ICD-10-CM | POA: Insufficient documentation

## 2016-11-21 DIAGNOSIS — J45909 Unspecified asthma, uncomplicated: Secondary | ICD-10-CM | POA: Insufficient documentation

## 2016-11-21 DIAGNOSIS — Y929 Unspecified place or not applicable: Secondary | ICD-10-CM | POA: Insufficient documentation

## 2016-11-21 NOTE — ED Triage Notes (Signed)
Patient here from home with complaints of lower back pain after lifting dresser tonight. Pain 10/10. Ambulatory.

## 2016-11-22 ENCOUNTER — Emergency Department (HOSPITAL_COMMUNITY)
Admission: EM | Admit: 2016-11-22 | Discharge: 2016-11-22 | Disposition: A | Discharge status: Home / Self Care | Payer: Self-pay | Attending: Emergency Medicine | Admitting: Emergency Medicine

## 2016-11-22 DIAGNOSIS — M545 Low back pain, unspecified: Secondary | ICD-10-CM

## 2016-11-22 MED ORDER — METHOCARBAMOL 500 MG PO TABS
500.0000 mg | ORAL_TABLET | Freq: Once | ORAL | Status: AC
  Administered 2016-11-22: 500 mg via ORAL
  Filled 2016-11-22: qty 1

## 2016-11-22 MED ORDER — METHOCARBAMOL 500 MG PO TABS: 500.0000 mg | ORAL_TABLET | Freq: Two times a day (BID) | ORAL | 0 refills | Status: DC

## 2016-11-22 MED ORDER — OXYCODONE-ACETAMINOPHEN 5-325 MG PO TABS
1.0000 | ORAL_TABLET | Freq: Once | ORAL | Status: AC
Start: 2016-11-22 — End: 2016-11-22
  Administered 2016-11-22: 1 via ORAL
  Filled 2016-11-22: qty 1

## 2016-11-22 MED ORDER — NAPROXEN 500 MG PO TABS: 500.0000 mg | ORAL_TABLET | Freq: Two times a day (BID) | ORAL | 0 refills | Status: DC

## 2016-11-22 NOTE — ED Provider Notes (Signed)
[facility]-EMERGENCY DEPT Provider Note   CSN: [id number] Arrival date & time: 11/21/16  2226     History   Chief Complaint Chief Complaint  Patient presents with  . Back Pain    HPI [name] is a 40 y.o. female with a hx of Asthma presents to the Emergency Department complaining of gradual, persistent, progressively worsening low back pain onset midmorning after moving a heavy dresser. She reports pain is on the right side of her low back and does not radiate. No weakness or numbness of her legs. No loss of bowel or bladder control. No saddle anesthesia. Bending and moving makes the symptoms worse. Lying still makes them better.. Patient denies fever, chills, nausea, vomiting, dysuria, hematuria, vaginal discharge. She denies previous history of cancer, IVDU use, anticoagulants or known trauma. Pt reports taking Aleve around 9pm without significant relief.    The history is provided by the patient and medical records. No language interpreter was used.    Past Medical History:  Diagnosis Date  . Asthma     There are no active problems to display for this patient.   Past Surgical History:  Procedure Laterality Date  . APPENDECTOMY      OB History    No data available       Home Medications    Prior to Admission medications   Medication Sig Start Date End Date Taking? Authorizing Provider  ibuprofen (ADVIL,MOTRIN) 200 MG tablet Take 400 mg by mouth every 6 (six) hours as needed for headache, mild pain or moderate pain.   Yes Historical Provider, MD  methocarbamol (ROBAXIN) 500 MG tablet Take 1 tablet (500 mg total) by mouth 2 (two) times daily. 11/22/16   Alexya Mcdaris, PA-C  naproxen (NAPROSYN) 500 MG tablet Take 1 tablet (500 mg total) by mouth 2 (two) times daily with a meal. 11/22/16   Dahlia ClientHannah Lakin Romer, PA-C    Family History No family history on file.  Social History Social History  Substance Use Topics  . Smoking status: Current Every Day Smoker    . Smokeless tobacco: Never Used  . Alcohol use No     Allergies   Penicillins   Review of Systems Review of Systems  Constitutional: Negative for fatigue and fever.  Respiratory: Negative for chest tightness and shortness of breath.   Cardiovascular: Negative for chest pain.  Gastrointestinal: Negative for abdominal pain, diarrhea, nausea and vomiting.  Genitourinary: Negative for dysuria, frequency, hematuria and urgency.  Musculoskeletal: Positive for back pain. Negative for gait problem, joint swelling, neck pain and neck stiffness.  Skin: Negative for rash.  Neurological: Negative for weakness, light-headedness, numbness and headaches.     Physical Exam Updated Vital Signs BP 153/82 (BP Location: Left Arm)   Pulse 82   Temp 98.4 F (36.9 C) (Oral)   Resp 18   SpO2 100%   Physical Exam  Constitutional: She appears well-developed and well-nourished. No distress.  HENT:  Head: Normocephalic and atraumatic.  Mouth/Throat: Oropharynx is clear and moist. No oropharyngeal exudate.  Eyes: Conjunctivae are normal.  Neck: Normal range of motion. Neck supple.  Full ROM without pain  Cardiovascular: Normal rate, regular rhythm and intact distal pulses.   Pulmonary/Chest: Effort normal and breath sounds normal. No respiratory distress. She has no wheezes.  Abdominal: Soft. She exhibits no distension. There is no tenderness.  Musculoskeletal:  Full range of motion of the T-spine and L-spine No midline tenderness to the  T-spine or L-spine Mild tenderness to palpation of  the right paraspinous muscles of the L-spine and over the SI joint  Lymphadenopathy:    She has no cervical adenopathy.  Neurological: She is alert. She has normal reflexes.  Reflex Scores:      Bicep reflexes are 2+ on the right side and 2+ on the left side.      Brachioradialis reflexes are 2+ on the right side and 2+ on the left side.      Patellar reflexes are 2+ on the right side and 2+ on the left  side.      Achilles reflexes are 2+ on the right side and 2+ on the left side. Speech is clear and goal oriented, follows commands Normal 5/5 strength in upper and lower extremities bilaterally including dorsiflexion and plantar flexion, strong and equal grip strength Sensation normal to light and sharp touch Moves extremities without ataxia, coordination intact Normal gait Normal balance No Clonus  Skin: Skin is warm and dry. No rash noted. She is not diaphoretic. No erythema.  Psychiatric: She has a normal mood and affect. Her behavior is normal.  Nursing note and vitals reviewed.    ED Treatments / Results   Procedures Procedures (including critical care time)  Medications Ordered in ED Medications  oxyCODONE-acetaminophen (PERCOCET/ROXICET) 5-325 MG per tablet 1 tablet (1 tablet Oral Given 11/22/16 0330)  methocarbamol (ROBAXIN) tablet 500 mg (500 mg Oral Given 11/22/16 0330)     Initial Impression / Assessment and Plan / ED Course  I have reviewed the triage vital signs and the nursing notes.  Pertinent labs & imaging results that were available during my care of the patient were reviewed by me and considered in my medical decision making (see chart for details).  Clinical Course     Patient with back pain.  No neurological deficits and normal neuro exam.  Patient can walk but states is painful.  No loss of bowel or bladder control.  No concern for cauda equina.  No fever, night sweats, weight loss, h/o cancer, IVDU.  RICE protocol and pain medicine indicated and discussed with patient.    Final Clinical Impressions(s) / ED Diagnoses   Final diagnoses:  Acute right-sided low back pain without sciatica    New Prescriptions New Prescriptions   METHOCARBAMOL (ROBAXIN) 500 MG TABLET    Take 1 tablet (500 mg total) by mouth 2 (two) times daily.   NAPROXEN (NAPROSYN) 500 MG TABLET    Take 1 tablet (500 mg total) by mouth 2 (two) times daily with a meal.     Dierdre ForthHannah  Kuron Docken, PA-C 11/22/16 [id number]    Tomasita CrumbleAdeleke Oni, MD 11/22/16 [id number]

## 2016-11-22 NOTE — Discharge Instructions (Signed)

## 2017-04-27 ENCOUNTER — Emergency Department (HOSPITAL_COMMUNITY)
Admission: EM | Admit: 2017-04-27 | Discharge: 2017-04-27 | Disposition: A | Discharge status: Home / Self Care | Payer: Self-pay | Attending: Emergency Medicine | Admitting: Emergency Medicine

## 2017-04-27 ENCOUNTER — Encounter (HOSPITAL_COMMUNITY): Payer: Self-pay | Admitting: Emergency Medicine

## 2017-04-27 DIAGNOSIS — F17200 Nicotine dependence, unspecified, uncomplicated: Secondary | ICD-10-CM | POA: Insufficient documentation

## 2017-04-27 DIAGNOSIS — M778 Other enthesopathies, not elsewhere classified: Secondary | ICD-10-CM

## 2017-04-27 DIAGNOSIS — G8929 Other chronic pain: Secondary | ICD-10-CM | POA: Insufficient documentation

## 2017-04-27 DIAGNOSIS — M7582 Other shoulder lesions, left shoulder: Secondary | ICD-10-CM | POA: Insufficient documentation

## 2017-04-27 DIAGNOSIS — J45909 Unspecified asthma, uncomplicated: Secondary | ICD-10-CM | POA: Insufficient documentation

## 2017-04-27 DIAGNOSIS — Z79899 Other long term (current) drug therapy: Secondary | ICD-10-CM | POA: Insufficient documentation

## 2017-04-27 DIAGNOSIS — M25512 Pain in left shoulder: Secondary | ICD-10-CM

## 2017-04-27 MED ORDER — MELOXICAM 15 MG PO TABS: 15.0000 mg | ORAL_TABLET | Freq: Every day | ORAL | 0 refills | Status: DC

## 2017-04-27 NOTE — ED Triage Notes (Signed)
Patient c/o left shoulder pain x2 months. Denies injury. Reports shoulder is "more stiff" in the mornings. Ambulatory to triage.

## 2017-04-27 NOTE — Discharge Instructions (Signed)
Wear shoulder sling for no more than 2 days, then begin performing gentle range of motion exercises. Alternate between ice and heat to your shoulder throughout the day, using an ice/heat pack for no more than 20 minutes at a time every hour. Use mobic daily as directed, don't use additional NSAIDs like ibuprofen or aleve while taking mobic. Use additional tylenol as needed for additional pain relief. Call orthopedic follow up today or tomorrow to schedule followup appointment for recheck of ongoing shoulder pain in 1-2 weeks. Return to the ER for changes or worsening symptoms.

## 2017-04-27 NOTE — ED Provider Notes (Signed)
[facility]-EMERGENCY DEPT Provider Note   CSN: [id number] Arrival date & time: 04/27/17  1453  By signing my name below, I, [name], attest that this documentation has been prepared under the direction and in the presence of  [address] Philippa Vessey, [employer]. Electronically Signed: Cynda Mack, Scribe. 04/27/17. 3:12 PM.  History   Chief Complaint Chief Complaint  Patient presents with  . Shoulder Pain    HPI Comments: [name] is a 41 y.o. female with a PMHx of asthma, who presents to the Emergency Department complaining of gradual onset, initially intermittent but now more constant left shoulder pain that began two months ago but worsened over the last 2 weeks. Patient states initially she thought it was sore due to the way she slept, but the pain has persisted. Patient describes the pain as 9/10, constant, sharp nonradiating left shoulder pain; aggravated by movement, and mildly relieved by tylenol and aleve. Patient has not had any injuries, but she does a lot of repetitive over the head movements at work. Patient is right hand dominant. Additionally she mentions that her back pain is still bothering her from the last time she was here (moved heavy furniture, seen 11/21/16, discharged with pain meds and robaxin, hasn't followed up; denies acute changes in this). Patient denies any fevers, chills, CP, SOB, abdominal pain, N/V/D/C, hematuria, dysuria, incontinence of urine/stool, saddle anesthesia/cauda equina symptoms, myalgias, numbness, tingling, focal weakness, or any other complaints at this time.   The history is provided by the patient and medical records. No language interpreter was used.  Shoulder Pain   This is a new problem. The current episode started more than 1 week ago (2 months ago). The problem occurs constantly. The problem has been gradually worsening. The pain is present in the left shoulder. The quality of the pain is described as sharp. The pain is at a severity of 9/10. The  pain is moderate. Associated symptoms include stiffness (in the mornings). Pertinent negatives include no numbness, full range of motion and no tingling. The symptoms are aggravated by activity (Movement). She has tried OTC pain medications for the symptoms. The treatment provided mild relief. There has been no history of extremity trauma. Family history is significant for no rheumatoid arthritis and no gout.    Past Medical History:  Diagnosis Date  . Asthma     There are no active problems to display for this patient.   Past Surgical History:  Procedure Laterality Date  . APPENDECTOMY      OB History    No data available       Home Medications    Prior to Admission medications   Medication Sig Start Date End Date Taking? Authorizing Provider  ibuprofen (ADVIL,MOTRIN) 200 MG tablet Take 400 mg by mouth every 6 (six) hours as needed for headache, mild pain or moderate pain.    [provider]  methocarbamol (ROBAXIN) 500 MG tablet Take 1 tablet (500 mg total) by mouth 2 (two) times daily. 11/22/16   Muthersbaugh, Dahlia Client, PA-C  naproxen (NAPROSYN) 500 MG tablet Take 1 tablet (500 mg total) by mouth 2 (two) times daily with a meal. 11/22/16   Muthersbaugh, Dahlia Client, PA-C    Family History No family history on file.  Social History Social History  Substance Use Topics  . Smoking status: Current Every Day Smoker  . Smokeless tobacco: Never Used  . Alcohol use No     Allergies   Penicillins   Review of Systems Review of Systems  Constitutional: Negative for chills and fever.  Respiratory: Negative for shortness of breath.   Cardiovascular: Negative for chest pain.  Gastrointestinal: Negative for abdominal pain, constipation, diarrhea, nausea and vomiting.  Genitourinary: Negative for difficulty urinating (no incontinence), dysuria and hematuria.  Musculoskeletal: Positive for arthralgias (left shoulder) and stiffness (in the mornings). Negative for joint  swelling.  Skin: Negative for color change.  Allergic/Immunologic: Negative for immunocompromised state.  Neurological: Negative for tingling, weakness and numbness.  Psychiatric/Behavioral: Negative for confusion.    10 Systems reviewed and all are negative for acute change except as noted in the HPI.   Physical Exam Updated Vital Signs BP (!) 163/100 (BP Location: Right Arm)   Pulse 79   Temp 99.2 F (37.3 C) (Oral)   Resp 18   LMP 03/31/2017   SpO2 100%   Physical Exam  Constitutional: She is oriented to person, place, and time. Vital signs are normal. She appears well-developed and well-nourished.  Non-toxic appearance. No distress.  Afebrile, nontoxic, NAD  HENT:  Head: Normocephalic and atraumatic.  Mouth/Throat: Mucous membranes are normal.  Eyes: Conjunctivae and EOM are normal. Right eye exhibits no discharge. Left eye exhibits no discharge.  Neck: Normal range of motion. Neck supple.  Cardiovascular: Normal rate and intact distal pulses.   Pulmonary/Chest: Effort normal. No respiratory distress.  Abdominal: Normal appearance. She exhibits no distension.  Musculoskeletal:       Left shoulder: She exhibits decreased range of motion and tenderness. She exhibits no bony tenderness, no swelling, no effusion, no crepitus, no deformity, normal pulse and normal strength.  Left shoulder with mildly limited ROM due to pain primarily with shoulder abduction, no focal bony TTP, with diffuse deltoid and bicep tendon TTP, no swelling/effusion, no bruising or erythema, no warmth, no crepitus/deformity, +apley scratch, +pain with resisted int and ext rotation. Strength and sensation grossly intact in all extremities, distal pulses intact.   Neurological: She is alert and oriented to person, place, and time. She has normal strength. No sensory deficit.  Skin: Skin is warm, dry and intact. No rash noted.  Psychiatric: She has a normal mood and affect. Her behavior is normal.  Nursing  note and vitals reviewed.    ED Treatments / Results  DIAGNOSTIC STUDIES: Oxygen Saturation is 100% on RA, normal by my interpretation.    COORDINATION OF CARE: 3:11 PM Discussed treatment plan with pt at bedside and pt agreed to plan, which includes a sling and mobic.    Labs (all labs ordered are listed, but only abnormal results are displayed) Labs Reviewed - No data to display  EKG  EKG Interpretation None       Radiology No results found.  Procedures Procedures (including critical care time)  Medications Ordered in ED Medications - No data to display   Initial Impression / Assessment and Plan / ED Course  I have reviewed the triage vital signs and the nursing notes.  Pertinent labs & imaging results that were available during my care of the patient were reviewed by me and considered in my medical decision making (see chart for details).     41 y.o. female here with L shoulder pain x2 months, does repetitive overhead activities at work. On exam, mild tenderness to deltoid and biceps tendon, +apley scratch, +pain with resisted int and ext rotation. NVI with soft compartments. Likely tendinitis. No trauma/injury, doubt need for emergent imaging. Will give sling for comfort for 2 days, then discussed ROM exercises after that. Advised ice/heat, will  start on mobic, discussed use of tylenol as well. F/up with ortho in 1-2wks for recheck. I explained the diagnosis and have given explicit precautions to return to the ER including for any other new or worsening symptoms. The patient understands and accepts the medical plan as it's been dictated and I have answered their questions. Discharge instructions concerning home care and prescriptions have been given. The patient is STABLE and is discharged to home in good condition.   I personally performed the services described in this documentation, which was scribed in my presence. The recorded information has been reviewed and is  accurate.   Final Clinical Impressions(s) / ED Diagnoses   Final diagnoses:  Chronic left shoulder pain  Tendinitis of left shoulder    New Prescriptions New Prescriptions   MELOXICAM (MOBIC) 15 MG TABLET    Take 1 tablet (15 mg total) by mouth daily. TAKE WITH [address]       Sherri Mcarthy, CarltonMercedes, [place] JerseyPA-C 04/27/17 1524    Little, Ambrose Finlandachel Morgan, MD 04/29/17 0700

## 2017-11-22 ENCOUNTER — Emergency Department (HOSPITAL_COMMUNITY)
Admission: EM | Admit: 2017-11-22 | Discharge: 2017-11-22 | Disposition: A | Discharge status: Home / Self Care | Payer: Self-pay | Attending: Emergency Medicine | Admitting: Emergency Medicine

## 2017-11-22 ENCOUNTER — Emergency Department (HOSPITAL_COMMUNITY): Payer: Self-pay

## 2017-11-22 ENCOUNTER — Encounter (HOSPITAL_COMMUNITY): Payer: Self-pay | Admitting: Emergency Medicine

## 2017-11-22 ENCOUNTER — Other Ambulatory Visit: Payer: Self-pay

## 2017-11-22 DIAGNOSIS — R079 Chest pain, unspecified: Secondary | ICD-10-CM | POA: Insufficient documentation

## 2017-11-22 DIAGNOSIS — Z5321 Procedure and treatment not carried out due to patient leaving prior to being seen by health care provider: Secondary | ICD-10-CM | POA: Insufficient documentation

## 2017-11-22 LAB — BASIC METABOLIC PANEL
ANION GAP: 8 (ref 5–15)
BUN: 9 mg/dL (ref 6–20)
CHLORIDE: 101 mmol/L (ref 101–111)
CO2: 25 mmol/L (ref 22–32)
Calcium: 8.7 mg/dL — ABNORMAL LOW (ref 8.9–10.3)
Creatinine, Ser: 0.72 mg/dL (ref 0.44–1.00)
GFR calc Af Amer: >60 mL/min (ref >60)
GLUCOSE: 105 mg/dL — AB (ref 65–99)
POTASSIUM: 3.7 mmol/L (ref 3.5–5.1)
Sodium: 134 mmol/L — ABNORMAL LOW (ref 135–145)

## 2017-11-22 LAB — CBC
HEMATOCRIT: 41.8 % (ref 36.0–46.0)
HEMOGLOBIN: 13.9 g/dL (ref 12.0–15.0)
MCH: 30.7 pg (ref 26.0–34.0)
MCHC: 33.3 g/dL (ref 30.0–36.0)
MCV: 92.3 fL (ref 78.0–100.0)
Platelets: 244 10*3/uL (ref 150–400)
RBC: 4.53 MIL/uL (ref 3.87–5.11)
RDW: 13 % (ref 11.5–15.5)
WBC: 7.8 10*3/uL (ref 4.0–10.5)

## 2017-11-22 LAB — I-STAT TROPONIN, ED: Troponin i, poc: 0.01 ng/mL (ref 0.00–0.08)

## 2017-11-22 LAB — I-STAT BETA HCG BLOOD, ED (MC, WL, AP ONLY): I-stat hCG, quantitative: <5 m[IU]/mL (ref <5)

## 2017-11-22 LAB — RAPID STREP SCREEN (MED CTR MEBANE ONLY): STREPTOCOCCUS, GROUP A SCREEN (DIRECT): NEGATIVE

## 2017-11-22 NOTE — ED Triage Notes (Signed)
Pt reports throat pain since Thursday, also reporting L breast pain radiating to her L shoulder similar to hx of tendonitis. States onset at 2AM. Reports some heavy lifting at work, denies shortness of breath and fevers. Hx asthma.

## 2017-11-24 LAB — CULTURE, GROUP A STREP (THRC)

## 2018-07-22 ENCOUNTER — Encounter (HOSPITAL_COMMUNITY): Payer: Self-pay

## 2018-07-22 ENCOUNTER — Other Ambulatory Visit: Payer: Self-pay

## 2018-07-22 ENCOUNTER — Emergency Department (HOSPITAL_COMMUNITY)
Admission: EM | Admit: 2018-07-22 | Discharge: 2018-07-22 | Disposition: A | Discharge status: Home / Self Care | Payer: Self-pay | Attending: Emergency Medicine | Admitting: Emergency Medicine

## 2018-07-22 DIAGNOSIS — H9202 Otalgia, left ear: Secondary | ICD-10-CM | POA: Insufficient documentation

## 2018-07-22 DIAGNOSIS — R519 Headache, unspecified: Secondary | ICD-10-CM

## 2018-07-22 DIAGNOSIS — F17210 Nicotine dependence, cigarettes, uncomplicated: Secondary | ICD-10-CM | POA: Insufficient documentation

## 2018-07-22 DIAGNOSIS — R51 Headache: Secondary | ICD-10-CM | POA: Insufficient documentation

## 2018-07-22 DIAGNOSIS — K0889 Other specified disorders of teeth and supporting structures: Secondary | ICD-10-CM | POA: Insufficient documentation

## 2018-07-22 MED ORDER — CLINDAMYCIN HCL 300 MG PO CAPS: 300.0000 mg | ORAL_CAPSULE | Freq: Three times a day (TID) | ORAL | 0 refills | Status: AC

## 2018-07-22 MED ORDER — CLINDAMYCIN HCL 300 MG PO CAPS
300.0000 mg | ORAL_CAPSULE | Freq: Once | ORAL | Status: AC
  Administered 2018-07-22: 300 mg via ORAL
  Filled 2018-07-22: qty 1

## 2018-07-22 MED ORDER — DEXAMETHASONE 4 MG PO TABS
10.0000 mg | ORAL_TABLET | Freq: Once | ORAL | Status: AC
  Administered 2018-07-22: 02:00:00 10 mg via ORAL
  Filled 2018-07-22: qty 2

## 2018-07-22 MED ORDER — IBUPROFEN 200 MG PO TABS
600.0000 mg | ORAL_TABLET | Freq: Once | ORAL | Status: AC
  Administered 2018-07-22: 600 mg via ORAL
  Filled 2018-07-22: qty 3

## 2018-07-22 NOTE — ED Triage Notes (Signed)
Pt presents to ED from home for facial pain and headache. Pt reports sinus congestion, and pain on L side of face. Pt reports tooth pain on R side, but isn't sure if they are related.

## 2018-07-22 NOTE — Discharge Instructions (Signed)
Take medications as prescribed. Follow up with a dentist of your choice for further evaluation and treatment of dental pain. See your primary care provider if sinus pressure headache does not resolve. You can also use plain saline nasal spray to reduce sinus pressure type pain. Return here as needed.

## 2018-07-22 NOTE — ED Provider Notes (Signed)
[facility]-EMERGENCY DEPT Provider Note   CSN: [id number] Arrival date & time: 07/22/18  0055     History   Chief Complaint Chief Complaint  Patient presents with  . Facial Pain  . Headache    HPI [name] is a 42 y.o. female.  Patient here for evaluation of left sided headache and facial pain for 3 days. The pain is constant and pressure-like. She reports congestion, left ear fullness and discomfort. No fever, nausea, vomiting. She states the pain is worse when she bends forward. No history of migraine. She took a dose of Tylenol for pain without relief. She also complains of a tooth on the right lower jaw that is loose and causes pain with eating. No right sided facial pain or swelling.   The history is provided by the patient. No language interpreter was used.    Past Medical History:  Diagnosis Date  . Asthma     There are no active problems to display for this patient.   Past Surgical History:  Procedure Laterality Date  . APPENDECTOMY       OB History   None      Home Medications    Prior to Admission medications   Medication Sig Start Date End Date Taking? Authorizing Provider  ibuprofen (ADVIL,MOTRIN) 200 MG tablet Take 400 mg by mouth every 6 (six) hours as needed for headache, mild pain or moderate pain.    [provider]  meloxicam (MOBIC) 15 MG tablet Take 1 tablet (15 mg total) by mouth daily. TAKE WITH MEALS 04/27/17   Street, Campbell StationMercedes, PA-C  methocarbamol (ROBAXIN) 500 MG tablet Take 1 tablet (500 mg total) by mouth 2 (two) times daily. 11/22/16   Muthersbaugh, Dahlia ClientHannah, PA-C  naproxen (NAPROSYN) 500 MG tablet Take 1 tablet (500 mg total) by mouth 2 (two) times daily with a meal. 11/22/16   Muthersbaugh, Boyd KerbsHannah, PA-C    Family History History reviewed. No pertinent family history.  Social History Social History   Tobacco Use  . Smoking status: Current Every Day Smoker  . Smokeless tobacco: Never Used    Substance Use Topics  . Alcohol use: No  . Drug use: Yes     Allergies   Penicillins   Review of Systems Review of Systems  Constitutional: Negative for chills and fever.  HENT: Positive for congestion, dental problem, ear pain, sinus pressure and sinus pain. Negative for hearing loss and nosebleeds.   Respiratory: Negative.  Negative for cough and shortness of breath.   Cardiovascular: Negative.   Gastrointestinal: Negative.  Negative for nausea.  Musculoskeletal: Negative.   Skin: Negative.   Neurological: Positive for headaches. Negative for weakness and light-headedness.     Physical Exam Updated Vital Signs BP (!) 153/90 (BP Location: Right Arm)   Pulse 71   Temp 98.2 F (36.8 C) (Oral)   Resp 16   Ht 5\' 4"  (1.626 m)   Wt 90.7 kg   SpO2 99%   BMI 34.33 kg/m   Physical Exam  Constitutional: She is oriented to person, place, and time. She appears well-developed and well-nourished.  HENT:  Head: Normocephalic.  Right Ear: Tympanic membrane normal.  Left Ear: There is swelling. Tympanic membrane is not erythematous. A middle ear effusion is present.  Nose: Mucosal edema present. Right sinus exhibits no maxillary sinus tenderness and no frontal sinus tenderness. Left sinus exhibits maxillary sinus tenderness and frontal sinus tenderness.  Mouth/Throat: Uvula is midline, oropharynx is clear and moist  and mucous membranes are normal. No dental caries.  #29 tender, loose but stable. No visualized abscess or infection. No facial swelling.  Eyes: Conjunctivae are normal.  Neck: Normal range of motion. Neck supple.  Cardiovascular: Normal rate and regular rhythm.  Pulmonary/Chest: Effort normal and breath sounds normal. She has no wheezes. She has no rales.  Abdominal: Soft. Bowel sounds are normal. There is no tenderness. There is no rebound and no guarding.  Musculoskeletal: Normal range of motion.  Neurological: She is alert and oriented to person, place, and time.   CN's 3-12 grossly intact. Speech is clear and focused. No facial asymmetry. No lateralizing weakness. Reflexes are equal. No deficits of coordination. Ambulatory without imbalance.    Skin: Skin is warm and dry. No rash noted.  Psychiatric: She has a normal mood and affect.     ED Treatments / Results  Labs (all labs ordered are listed, but only abnormal results are displayed) Labs Reviewed - No data to display  EKG None  Radiology No results found.  Procedures Procedures (including critical care time)  Medications Ordered in ED Medications - No data to display   Initial Impression / Assessment and Plan / ED Course  I have reviewed the triage vital signs and the nursing notes.  Pertinent labs & imaging results that were available during my care of the patient were reviewed by me and considered in my medical decision making (see chart for details).     Patient here with left facial pain and headache x 3 days. No fever. REports congestion, ear pain. Also complains of unrelated right lower dental pain with loose tooth.   Normal neurologic exam. She has symptoms of congestion and sinus tenderness on the left that reproduces her headache. Feel headache is sinus headache and will require supportive care.   Dental pain on the right with loose single tooth. Will cover with abx and provide dental referral. No visualized drainable abscess.     Final Clinical Impressions(s) / ED Diagnoses   Final diagnoses:  None   1. Sinus headache 2. Dental pain  ED Discharge Orders    None       Elpidio Anis, PA-C 07/22/18 0140    Gilda Crease, MD 07/22/18 [phone]

## 2018-12-20 ENCOUNTER — Emergency Department (HOSPITAL_COMMUNITY): Payer: Self-pay

## 2018-12-20 ENCOUNTER — Emergency Department (HOSPITAL_COMMUNITY)
Admission: EM | Admit: 2018-12-20 | Discharge: 2018-12-21 | Disposition: A | Discharge status: Home / Self Care | Payer: Self-pay | Attending: Emergency Medicine | Admitting: Emergency Medicine

## 2018-12-20 ENCOUNTER — Encounter (HOSPITAL_COMMUNITY): Payer: Self-pay

## 2018-12-20 ENCOUNTER — Other Ambulatory Visit: Payer: Self-pay

## 2018-12-20 DIAGNOSIS — Z5321 Procedure and treatment not carried out due to patient leaving prior to being seen by health care provider: Secondary | ICD-10-CM | POA: Insufficient documentation

## 2018-12-20 DIAGNOSIS — R079 Chest pain, unspecified: Secondary | ICD-10-CM | POA: Insufficient documentation

## 2018-12-20 LAB — BASIC METABOLIC PANEL
Anion gap: 10 (ref 5–15)
BUN: 7 mg/dL (ref 6–20)
CALCIUM: 9.1 mg/dL (ref 8.9–10.3)
CHLORIDE: 104 mmol/L (ref 98–111)
CO2: 24 mmol/L (ref 22–32)
CREATININE: 0.98 mg/dL (ref 0.44–1.00)
GFR calc non Af Amer: >60 mL/min (ref >60)
Glucose, Bld: 137 mg/dL — ABNORMAL HIGH (ref 70–99)
Potassium: 3.6 mmol/L (ref 3.5–5.1)
SODIUM: 138 mmol/L (ref 135–145)

## 2018-12-20 LAB — CBC
HEMATOCRIT: 44.2 % (ref 36.0–46.0)
Hemoglobin: 14.1 g/dL (ref 12.0–15.0)
MCH: 29 pg (ref 26.0–34.0)
MCHC: 31.9 g/dL (ref 30.0–36.0)
MCV: 90.9 fL (ref 80.0–100.0)
NRBC: 0 % (ref 0.0–0.2)
PLATELETS: 241 10*3/uL (ref 150–400)
RBC: 4.86 MIL/uL (ref 3.87–5.11)
RDW: 12.4 % (ref 11.5–15.5)
WBC: 7.6 10*3/uL (ref 4.0–10.5)

## 2018-12-20 LAB — I-STAT BETA HCG BLOOD, ED (MC, WL, AP ONLY)

## 2018-12-20 LAB — I-STAT TROPONIN, ED: TROPONIN I, POC: 0.06 ng/mL (ref 0.00–0.08)

## 2018-12-20 MED ORDER — SODIUM CHLORIDE 0.9% FLUSH: 3.0000 mL | Freq: Once | INTRAVENOUS | Status: DC

## 2018-12-20 NOTE — ED Triage Notes (Signed)
Pt here for right sided chest pain and goes to her back.  Pain there for last 3 days.  Nothing makes it worse or better.

## 2018-12-21 MED ORDER — ALBUTEROL SULFATE (2.5 MG/3ML) 0.083% IN NEBU
2.5000 mg | INHALATION_SOLUTION | Freq: Once | RESPIRATORY_TRACT | Status: AC
  Administered 2018-12-21: 2.5 mg via RESPIRATORY_TRACT
  Filled 2018-12-21: qty 3

## 2018-12-21 NOTE — ED Notes (Signed)
Pt states she has towork in AM. Pt is leaving AMA

## 2019-10-15 ENCOUNTER — Emergency Department (HOSPITAL_COMMUNITY)
Admission: EM | Admit: 2019-10-15 | Discharge: 2019-10-15 | Disposition: A | Discharge status: Home / Self Care | Payer: Self-pay | Attending: Emergency Medicine | Admitting: Emergency Medicine

## 2019-10-15 ENCOUNTER — Encounter (HOSPITAL_COMMUNITY): Payer: Self-pay | Admitting: Emergency Medicine

## 2019-10-15 ENCOUNTER — Other Ambulatory Visit: Payer: Self-pay

## 2019-10-15 DIAGNOSIS — J45909 Unspecified asthma, uncomplicated: Secondary | ICD-10-CM | POA: Insufficient documentation

## 2019-10-15 DIAGNOSIS — Z79899 Other long term (current) drug therapy: Secondary | ICD-10-CM | POA: Insufficient documentation

## 2019-10-15 DIAGNOSIS — I10 Essential (primary) hypertension: Secondary | ICD-10-CM

## 2019-10-15 DIAGNOSIS — K0889 Other specified disorders of teeth and supporting structures: Secondary | ICD-10-CM

## 2019-10-15 DIAGNOSIS — K047 Periapical abscess without sinus: Secondary | ICD-10-CM

## 2019-10-15 DIAGNOSIS — F17210 Nicotine dependence, cigarettes, uncomplicated: Secondary | ICD-10-CM | POA: Insufficient documentation

## 2019-10-15 MED ORDER — KETOROLAC TROMETHAMINE 60 MG/2ML IM SOLN
60.0000 mg | Freq: Once | INTRAMUSCULAR | Status: AC
  Administered 2019-10-15: 60 mg via INTRAMUSCULAR
  Filled 2019-10-15: qty 2

## 2019-10-15 MED ORDER — CLINDAMYCIN HCL 300 MG PO CAPS
450.0000 mg | ORAL_CAPSULE | Freq: Once | ORAL | Status: AC
  Administered 2019-10-15: 450 mg via ORAL
  Filled 2019-10-15: qty 1

## 2019-10-15 MED ORDER — CLINDAMYCIN HCL 150 MG PO CAPS: 450.0000 mg | ORAL_CAPSULE | Freq: Three times a day (TID) | ORAL | 0 refills | Status: DC

## 2019-10-15 NOTE — Discharge Instructions (Addendum)
1. Medications: Clindamycin, usual home medications 2. Treatment: rest, drink plenty of fluids, take medications as prescribed 3. Follow Up: Please followup with dentistry within 1 week.  Please see your primary care provider for recheck of your blood pressure. Return to the ER for high fevers, difficulty breathing, difficulty swallowing or other concerning symptoms

## 2019-10-15 NOTE — ED Provider Notes (Signed)
[facility]-EMERGENCY DEPT Provider Note   CSN: [id number] Arrival date & time: 10/15/19  2023     History   Chief Complaint Chief Complaint  Patient presents with  . Dental Pain    HPI [name] is a 43 y.o. female with a hx of asthma presents to the Emergency Department complaining of gradual, persistent, progressively worsening right lower dental pain with associated swelling onset 3 days ago.  Patient reports she has a tooth at the site that is loose.  She has not seen a dentist in 2010.  She reports swelling around the gingiva but no difficulty speaking or swallowing.  She denies external facial swelling or redness.  No fevers or chills, nausea or vomiting.  Patient denies history of diabetes or other immunocompromise.  She has taken both Tylenol and ibuprofen without significant relief.  Cold air makes her pain worse as well as eating.     The history is provided by the patient and medical records. No language interpreter was used.    Past Medical History:  Diagnosis Date  . Asthma     There are no active problems to display for this patient.   Past Surgical History:  Procedure Laterality Date  . APPENDECTOMY       OB History   No obstetric history on file.      Home Medications    Prior to Admission medications   Medication Sig Start Date End Date Taking? Authorizing Provider  clindamycin (CLEOCIN) 150 MG capsule Take 3 capsules (450 mg total) by mouth 3 (three) times daily. 10/15/19   Chancey Ringel, Dahlia Client, PA-C  ibuprofen (ADVIL,MOTRIN) 200 MG tablet Take 400 mg by mouth every 6 (six) hours as needed for headache, mild pain or moderate pain.    [provider]  meloxicam (MOBIC) 15 MG tablet Take 1 tablet (15 mg total) by mouth daily. TAKE WITH MEALS 04/27/17   Street, [place], PA-C  methocarbamol (ROBAXIN) 500 MG tablet Take 1 tablet (500 mg total) by mouth 2 (two) times daily. 11/22/16   Gizzelle Lacomb, Dahlia Client, PA-C  naproxen  (NAPROSYN) 500 MG tablet Take 1 tablet (500 mg total) by mouth 2 (two) times daily with a meal. 11/22/16   Yosiah Jasmin, Dahlia Client, PA-C    Family History No family history on file.  Social History Social History   Tobacco Use  . Smoking status: Current Every Day Smoker  . Smokeless tobacco: Never Used  Substance Use Topics  . Alcohol use: No  . Drug use: Yes     Allergies   Penicillins   Review of Systems Review of Systems  Constitutional: Negative for chills and fever.  HENT: Positive for dental problem. Negative for facial swelling, sinus pressure and sinus pain.   Eyes: Negative for visual disturbance.  Gastrointestinal: Negative for nausea and vomiting.  Neurological: Negative for headaches.     Physical Exam Updated Vital Signs BP (!) 170/96 (BP Location: Left Arm)   Pulse 67   Temp 98.7 F (37.1 C) (Oral)   Resp 15   Ht 5\' 4"  (1.626 m)   Wt 86.2 kg   LMP 10/15/2019   SpO2 99%   BMI 32.61 kg/m   Physical Exam Vitals signs and nursing note reviewed.  Constitutional:      Appearance: She is well-developed.  HENT:     Head: Normocephalic.     Nose: Nose normal.     Right Sinus: No maxillary sinus tenderness or frontal sinus tenderness.  Left Sinus: No maxillary sinus tenderness or frontal sinus tenderness.     Mouth/Throat:     Mouth: No lacerations or oral lesions.     Dentition: Abnormal dentition. Dental caries present.     Pharynx: Uvula midline. No oropharyngeal exudate, posterior oropharyngeal erythema or uvula swelling.     Tonsils: No tonsillar abscesses.      Comments: Tenderness to palpation over the right lower back 2 molars.  Mild erythema and swelling of the gingiva but no gross abscess. Eyes:     General:        Right eye: No discharge.        Left eye: No discharge.     Conjunctiva/sclera: Conjunctivae normal.     Pupils: Pupils are equal, round, and reactive to light.  Neck:     Musculoskeletal: Normal range of motion and neck  supple.     Comments: No stridor Handling secretions without difficulty No nuchal rigidity No cervical lymphadenopathy  Cardiovascular:     Rate and Rhythm: Normal rate.  Pulmonary:     Effort: Pulmonary effort is normal. No respiratory distress.  Abdominal:     General: There is no distension.  Lymphadenopathy:     Cervical: No cervical adenopathy.  Skin:    General: Skin is warm and dry.  Neurological:     Mental Status: She is alert.      ED Treatments / Results   Procedures Procedures (including critical care time)  Medications Ordered in ED Medications  ketorolac (TORADOL) injection 60 mg (has no administration in time range)  clindamycin (CLEOCIN) capsule 450 mg (has no administration in time range)     Initial Impression / Assessment and Plan / ED Course  I have reviewed the triage vital signs and the nursing notes.  Pertinent labs & imaging results that were available during my care of the patient were reviewed by me and considered in my medical decision making (see chart for details).  Clinical Course as of Oct 14 2314  Sat Oct 15, 2019  2312 HTN noted.  No CP or SOB.  No signs of hypertensive urgency.  Patient will need primary care follow-up for this.  BP(!): 170/96 [HM]    Clinical Course User Index [HM] Masiyah Engen, Jarrett Soho, PA-C       Patient with toothache.  No gross abscess.  Exam unconcerning for Ludwig's angina or spread of infection.  Will treat with Clindamycin and anti-inflammatories medicine.  Urged patient to follow-up with dentist.    Additionally, patient noted to be hypertensive.  No signs of hypertensive urgency.  Patient will need close follow-up with primary care for this.   Final Clinical Impressions(s) / ED Diagnoses   Final diagnoses:  Pain, dental  Dental infection  Hypertension, unspecified type    ED Discharge Orders         Ordered    clindamycin (CLEOCIN) 150 MG capsule  3 times daily     10/15/19 2313            Jeily Guthridge, Gwenlyn Perking 10/15/19 2315    Lajean Saver, MD 10/16/19 1504

## 2019-10-15 NOTE — ED Triage Notes (Signed)
Pt arriving with dental pain/possible abscess on bottom right side. Pt has some facial swelling on right side.

## 2019-11-20 ENCOUNTER — Emergency Department (HOSPITAL_COMMUNITY)
Admission: EM | Admit: 2019-11-20 | Discharge: 2019-11-20 | Disposition: A | Discharge status: Home / Self Care | Payer: Self-pay | Attending: Emergency Medicine | Admitting: Emergency Medicine

## 2019-11-20 ENCOUNTER — Encounter (HOSPITAL_COMMUNITY): Payer: Self-pay | Admitting: Emergency Medicine

## 2019-11-20 ENCOUNTER — Emergency Department (HOSPITAL_BASED_OUTPATIENT_CLINIC_OR_DEPARTMENT_OTHER): Payer: Self-pay

## 2019-11-20 ENCOUNTER — Other Ambulatory Visit: Payer: Self-pay

## 2019-11-20 DIAGNOSIS — M79605 Pain in left leg: Secondary | ICD-10-CM | POA: Insufficient documentation

## 2019-11-20 DIAGNOSIS — Z79899 Other long term (current) drug therapy: Secondary | ICD-10-CM | POA: Insufficient documentation

## 2019-11-20 DIAGNOSIS — R52 Pain, unspecified: Secondary | ICD-10-CM

## 2019-11-20 DIAGNOSIS — F17210 Nicotine dependence, cigarettes, uncomplicated: Secondary | ICD-10-CM | POA: Insufficient documentation

## 2019-11-20 DIAGNOSIS — J45909 Unspecified asthma, uncomplicated: Secondary | ICD-10-CM | POA: Insufficient documentation

## 2019-11-20 DIAGNOSIS — Z791 Long term (current) use of non-steroidal anti-inflammatories (NSAID): Secondary | ICD-10-CM | POA: Insufficient documentation

## 2019-11-20 MED ORDER — METHYLPREDNISOLONE 4 MG PO TBPK: ORAL_TABLET | ORAL | 0 refills | Status: DC

## 2019-11-20 MED ORDER — PREDNISONE 20 MG PO TABS
60.0000 mg | ORAL_TABLET | Freq: Once | ORAL | Status: AC
  Administered 2019-11-20: 60 mg via ORAL
  Filled 2019-11-20: qty 3

## 2019-11-20 NOTE — Progress Notes (Signed)
VASCULAR LAB PRELIMINARY  PRELIMINARY  PRELIMINARY  PRELIMINARY  Left lower extremity venous duplex completed.    Preliminary report:  See CV proc for preliminary results.  Jonne Ply, PA-C report.  Zhyon Antenucci, RVT 11/20/2019, 1:51 PM

## 2019-11-20 NOTE — ED Provider Notes (Signed)
[facility]-EMERGENCY DEPT Provider Note   CSN: [id number] Arrival date & time: 11/20/19  1059     History Chief Complaint  Patient presents with  . Leg Pain    [name] is a 43 y.o. female.  [name] is a 43 y.o. female with a history of asthma, who presents to the emergency department for evaluation of left leg pain.  Patient states that she started having some mild pain in the calf and posterior thigh, patient states that over the past week pain has become much more persistent and she has noted some intermittent swelling in the calf and lower leg.  She states that pain is worse when she is up at work, states that she has been recently been working very long hours over the holiday and spends most of the day on her feet.  She states that pain is worse with forward bending and when she puts a lot of weight on the leg.  She denies any trauma or injury to the leg.  No recent falls, heavy lifting or strains that she has noticed.  She denies focal pain over the knee ankle or hip.  No associated back pain.  No numbness tingling or weakness.  She has not noted any discoloration or rash.  She is taken some Aleve intermittently with some improvement, and has used Aspercreme which initially was helpful but over the past week has provided no relief.  No other aggravating or alleviating factors.  No history of blood clots or DVT, no associated chest pain or shortness of breath.  She has no similar pain in the right leg and no pain over her other extremities or joints.  The history is provided by the patient.       Past Medical History:  Diagnosis Date  . Asthma     There are no problems to display for this patient.   Past Surgical History:  Procedure Laterality Date  . APPENDECTOMY       OB History   No obstetric history on file.     No family history on file.  Social History   Tobacco Use  . Smoking status: Current Every Day Smoker  . Smokeless  tobacco: Never Used  Substance Use Topics  . Alcohol use: No  . Drug use: Yes    Home Medications Prior to Admission medications   Medication Sig Start Date End Date Taking? Authorizing Provider  clindamycin (CLEOCIN) 150 MG capsule Take 3 capsules (450 mg total) by mouth 3 (three) times daily. 10/15/19   Muthersbaugh, Dahlia ClientHannah, PA-C  ibuprofen (ADVIL,MOTRIN) 200 MG tablet Take 400 mg by mouth every 6 (six) hours as needed for headache, mild pain or moderate pain.    [provider]  meloxicam (MOBIC) 15 MG tablet Take 1 tablet (15 mg total) by mouth daily. TAKE WITH MEALS 04/27/17   Street, RutherfordMercedes, PA-C  methocarbamol (ROBAXIN) 500 MG tablet Take 1 tablet (500 mg total) by mouth 2 (two) times daily. 11/22/16   Muthersbaugh, Dahlia ClientHannah, PA-C  methylPREDNISolone (MEDROL DOSEPAK) 4 MG TBPK tablet Take as directed 11/20/19   Dartha LodgeFord, Deziah Renwick N, PA-C  naproxen (NAPROSYN) 500 MG tablet Take 1 tablet (500 mg total) by mouth 2 (two) times daily with a meal. 11/22/16   Muthersbaugh, Dahlia ClientHannah, PA-C    Allergies    Penicillins  Review of Systems   Review of Systems  Constitutional: Negative for chills and fever.  Respiratory: Negative for shortness of breath.   Cardiovascular:  Negative for chest pain.  Musculoskeletal: Positive for myalgias. Negative for arthralgias and joint swelling.  Skin: Negative for color change and rash.  Neurological: Negative for weakness and numbness.    Physical Exam Updated Vital Signs BP (!) 179/89 (BP Location: Right Arm)   Pulse 75   Temp 98 F (36.7 C) (Oral)   Resp 19   SpO2 100%   Physical Exam Vitals and nursing note reviewed.  Constitutional:      General: She is not in acute distress.    Appearance: Normal appearance. She is well-developed and normal weight. She is not diaphoretic.  HENT:     Head: Normocephalic and atraumatic.  Eyes:     General:        Right eye: No discharge.        Left eye: No discharge.  Pulmonary:     Effort:  Pulmonary effort is normal. No respiratory distress.  Musculoskeletal:     Comments: Tenderness to palpation over the posterior lateral calf and thigh of the left leg without palpable deformity or swelling, no overlying erythema or warmth.  Pain is worse with dorsiflexion and plantarflexion of the foot and flexion and extension of the knee but patient does not have focal tenderness over the hip knee or ankle.  2+ DP and TP pulses, normal sensation and strength  Skin:    General: Skin is warm and dry.  Neurological:     Mental Status: She is alert and oriented to person, place, and time.     Coordination: Coordination normal.  Psychiatric:        Mood and Affect: Mood normal.        Behavior: Behavior normal.     ED Results / Procedures / Treatments   Labs (all labs ordered are listed, but only abnormal results are displayed) Labs Reviewed - No data to display  EKG None  Radiology VAS [place] LOWER EXTREMITY VENOUS (DVT) ([facility] and [facility] 7a-7p)  Result Date: 11/20/2019  Lower Venous Study Indications: Pain.  Comparison Study: No prior study on file for comparison Performing Technologist: Sharion Dove RVS  Examination Guidelines: A complete evaluation includes B-mode imaging, spectral Doppler, color Doppler, and power Doppler as needed of all accessible portions of each vessel. Bilateral testing is considered an integral part of a complete examination. Limited examinations for reoccurring indications may be performed as noted.  +-----+---------------+---------+-----------+----------+--------------+ RIGHTCompressibilityPhasicitySpontaneityPropertiesThrombus Aging +-----+---------------+---------+-----------+----------+--------------+ CFV  Full           Yes      Yes                                 +-----+---------------+---------+-----------+----------+--------------+   +---------+---------------+---------+-----------+----------+--------------+ LEFT      CompressibilityPhasicitySpontaneityPropertiesThrombus Aging +---------+---------------+---------+-----------+----------+--------------+ CFV      Full           Yes      Yes                                 +---------+---------------+---------+-----------+----------+--------------+ SFJ      Full                                                        +---------+---------------+---------+-----------+----------+--------------+ FV Prox  Full                                                        +---------+---------------+---------+-----------+----------+--------------+ FV Mid   Full                                                        +---------+---------------+---------+-----------+----------+--------------+ FV DistalFull                                                        +---------+---------------+---------+-----------+----------+--------------+ PFV      Full                                                        +---------+---------------+---------+-----------+----------+--------------+ POP      Full           Yes      Yes                                 +---------+---------------+---------+-----------+----------+--------------+ PTV      Full                                                        +---------+---------------+---------+-----------+----------+--------------+ PERO     Full                                                        +---------+---------------+---------+-----------+----------+--------------+     Summary: Right: No evidence of common femoral vein obstruction. Left: There is no evidence of deep vein thrombosis in the lower extremity.  *See table(s) above for measurements and observations. Electronically signed by Sherald Hess MD on 11/20/2019 at 3:58:01 PM.    Final     Procedures Procedures (including critical care time)  Medications Ordered in ED Medications  predniSONE (DELTASONE) tablet 60 mg (60 mg Oral  Given 11/20/19 1412)    ED Course  I have reviewed the triage vital signs and the nursing notes.  Pertinent labs & imaging results that were available during my care of the patient were reviewed by me and considered in my medical decision making (see chart for details).  Clinical Course as of Nov 20 1647  Sun Nov 20, 2019  1351 DVT study is negative  VAS [place] LOWER EXTREMITY VENOUS (DVT) ([facility] and [facility] 7a-7p) [KF]    Clinical Course User Index [KF] Legrand Rams   MDM Rules/Calculators/A&P  Patient presents with 3 weeks of atraumatic left leg pain present in the posterior calf and thigh, no obvious swelling on exam today but patient has reported some over the past few days.  No associated chest pain or shortness of breath to suggest PE and patient does not have significant risk factors for blood clot.  She does not have focal pain over the knee or ankle joint and has no overlying skin changes to suggest cellulitis.  Given that pain is worse with forward bending and seems to radiate down the posterior leg I also question whether sciatica could be contributing, patient does not have any associated back pain.  DVT study ordered to rule out blood clot, which was negative.  Will treat with course of prednisone for potential sciatica and have patient follow-up with sports medicine, work note provided encourage patient to rest over the next few days, provided instructions for stretches as well.  Return precautions discussed.  Discharged home in good condition.  Final Clinical Impression(s) / ED Diagnoses Final diagnoses:  Left leg pain    Rx / DC Orders ED Discharge Orders         Ordered    methylPREDNISolone (MEDROL DOSEPAK) 4 MG TBPK tablet     11/20/19 1357           Jodi Geralds [place], [place] 11/20/19 1652    Alvira Monday, MD 11/21/19 1343

## 2019-11-20 NOTE — ED Triage Notes (Signed)
Patient here from home with complaints of left leg pain from thigh down to ankle. Denies injury. Patient correlates pain to working long hours at work. Pain with applying weight.

## 2019-11-20 NOTE — Discharge Instructions (Signed)
Please take steroids in addition to Aleve twice daily and Tylenol to help treat pain.  Exercise instructions as likely wonder if this may be contributing to your pain.  Your ultrasound today did not show any evidence of blood clot.  If your symptoms persist please follow-up with Dr. Barbaraann Barthel with sports medicine for further evaluation.

## 2020-03-24 IMAGING — CR DG CHEST 2V
2 series · 2 of 2 positions shown · non-contrast
Comparison: 11/22/2017

CLINICAL DATA: Chest pain. Nausea and vomiting.

EXAM:
CHEST - 2 VIEW

[chest pa]
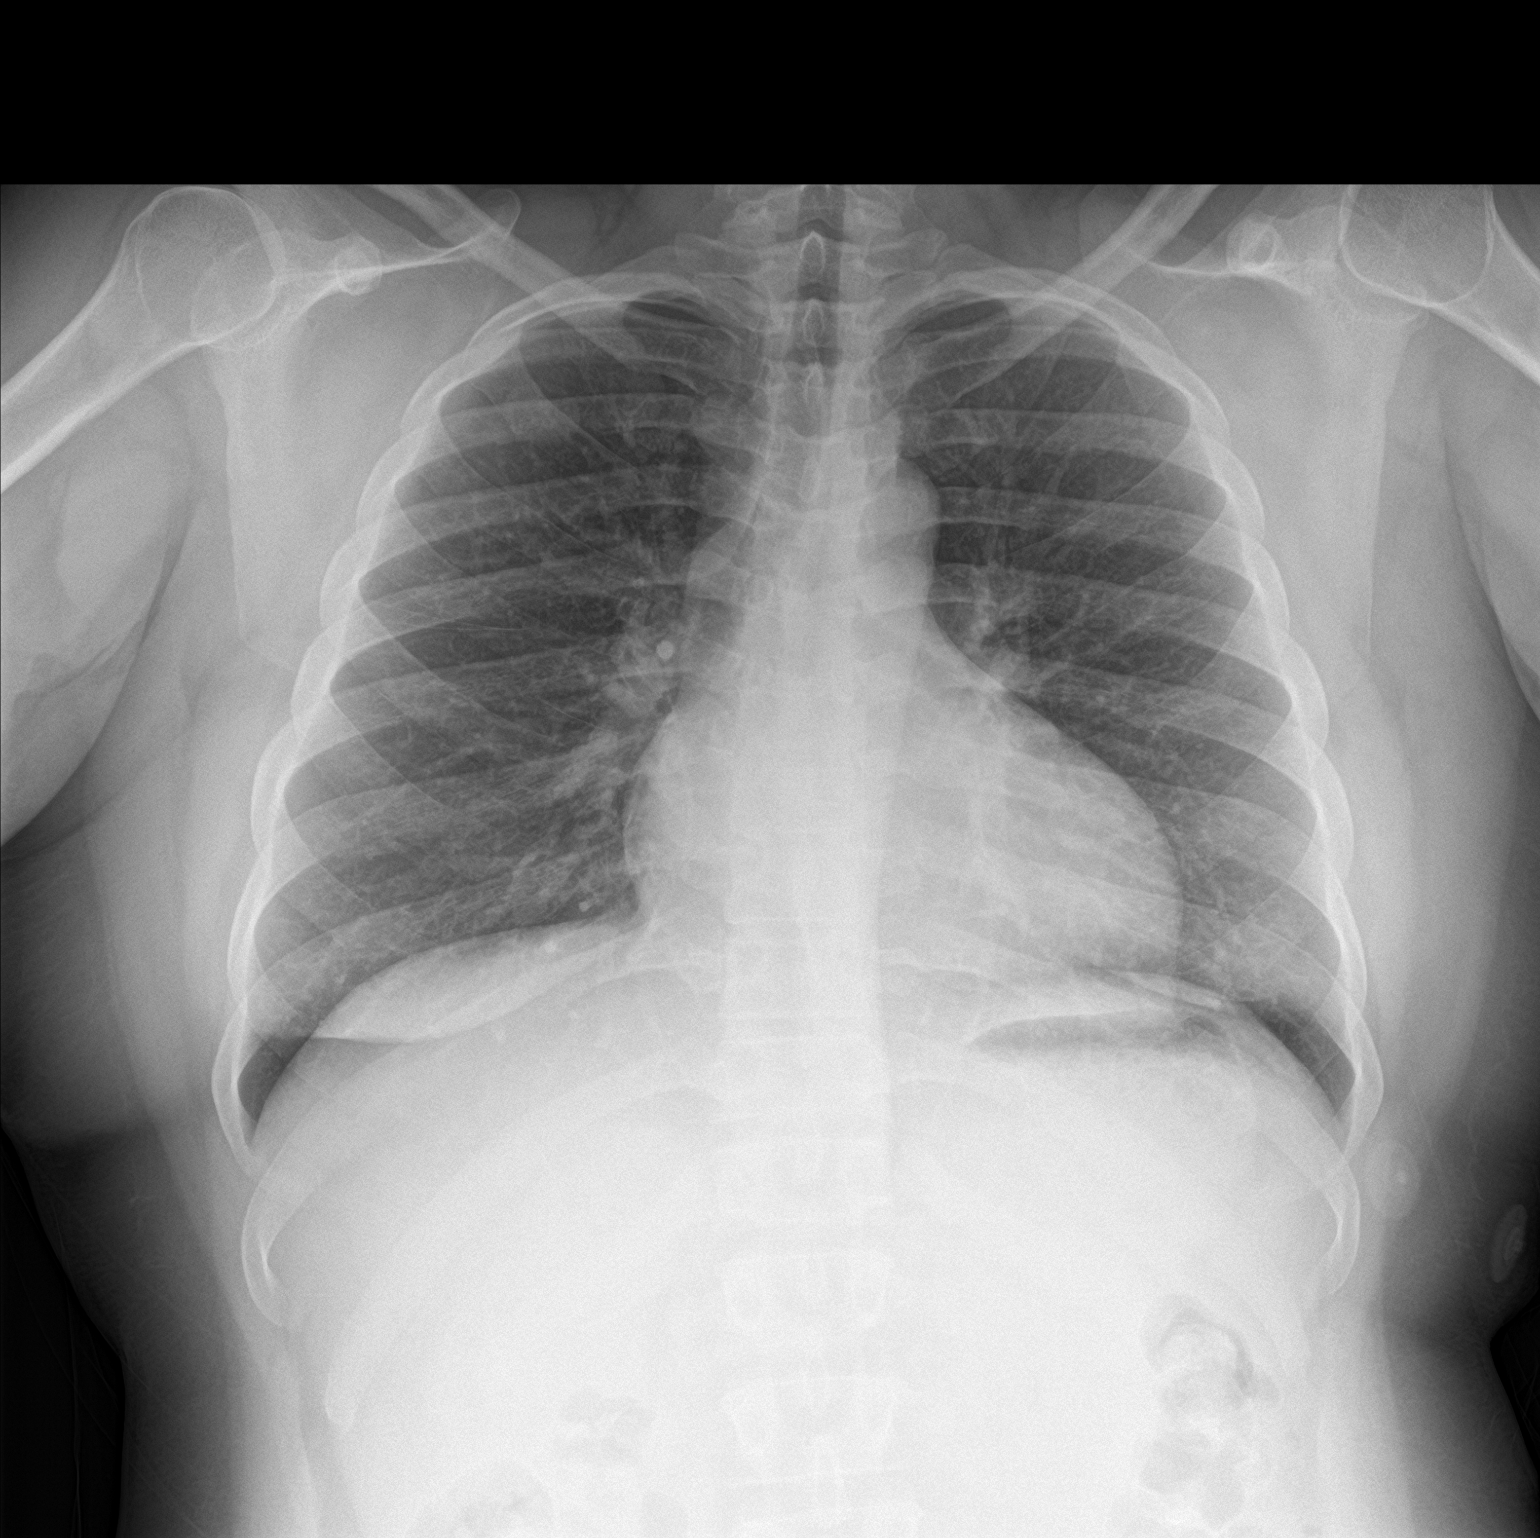

[chest lat]
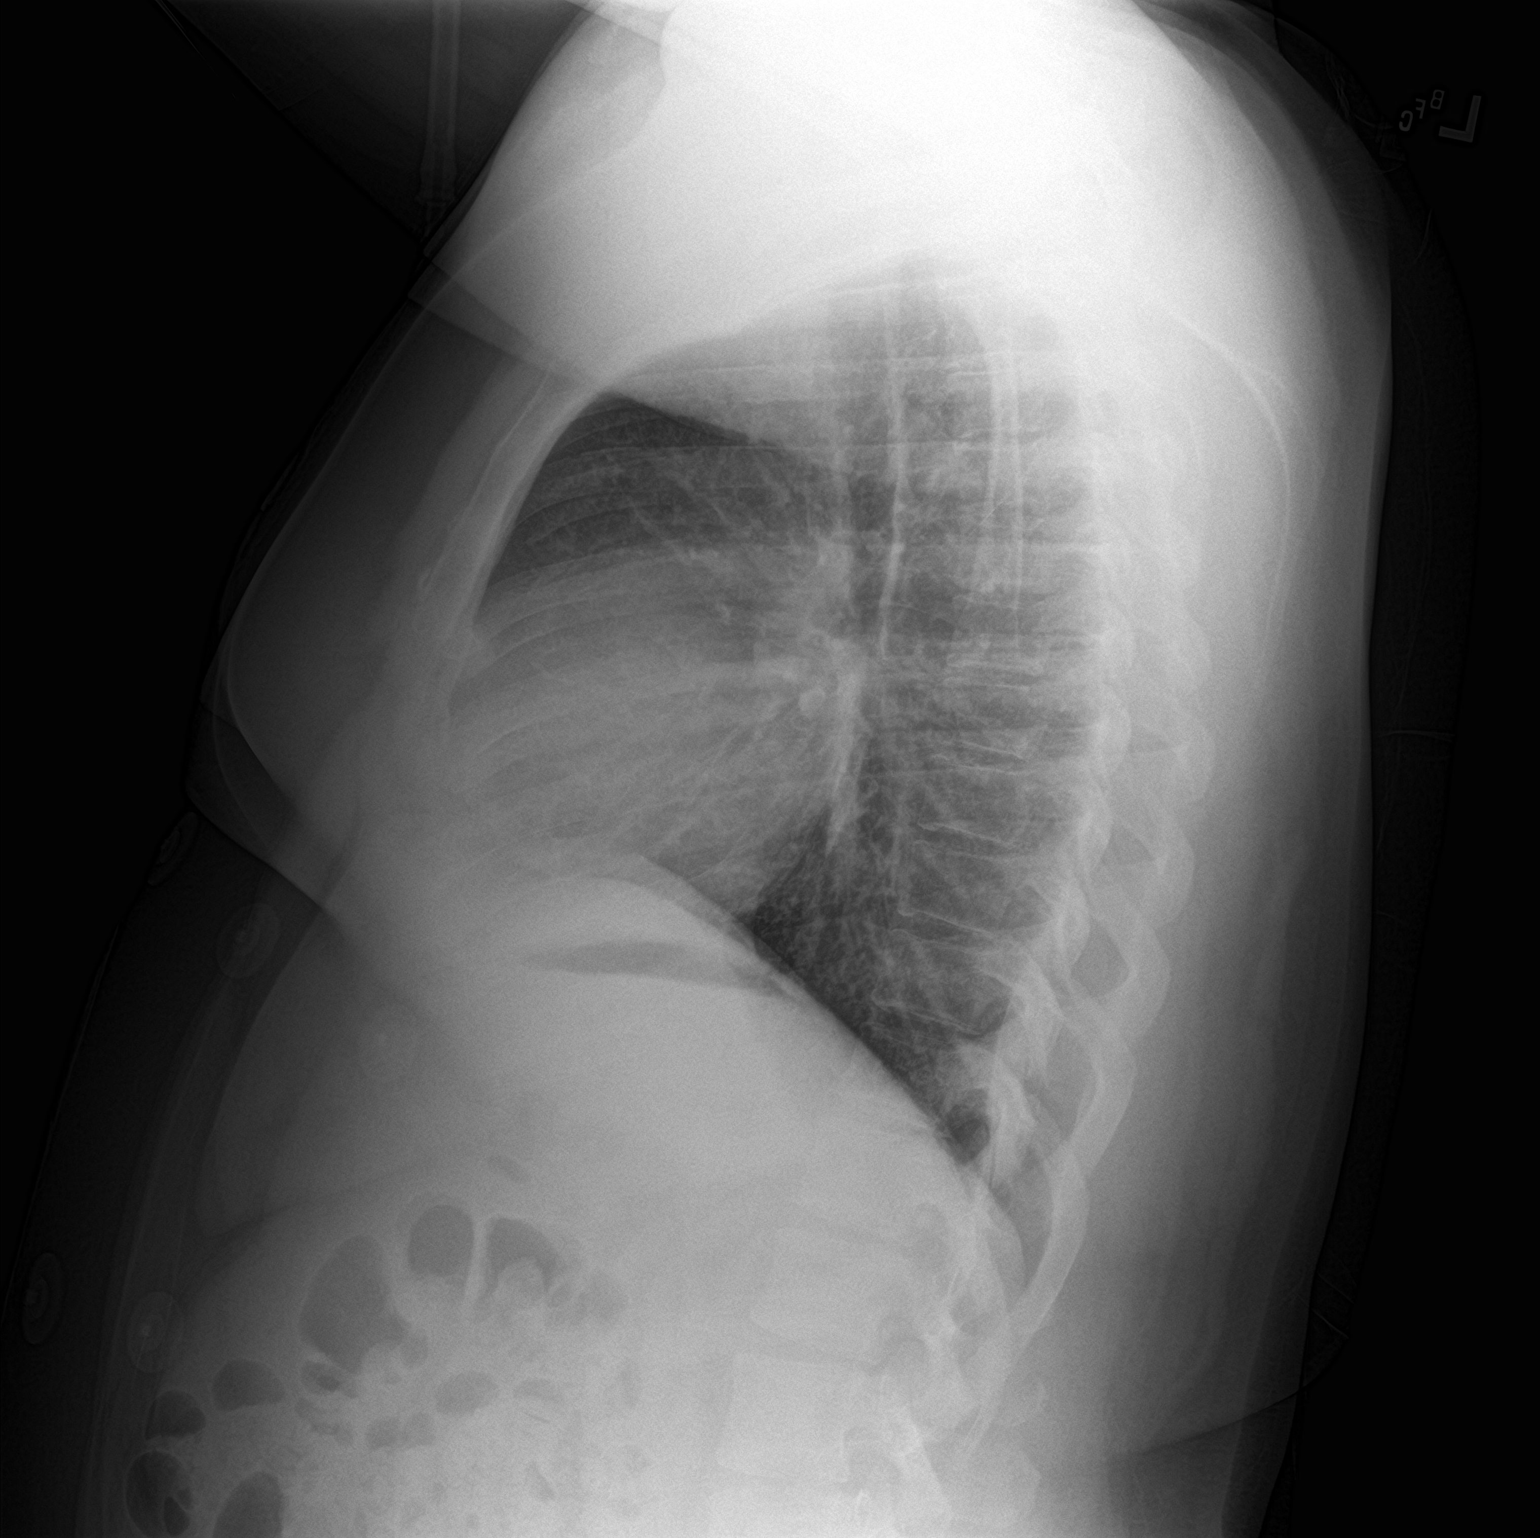

[2 of 2 positions shown; findings below may reference images not displayed]

FINDINGS: The cardiomediastinal contours are normal. The lungs are clear.
Pulmonary vasculature is normal. No consolidation, pleural effusion,
or pneumothorax. No acute osseous abnormalities are seen. EKG leads
project over the chest.
IMPRESSION: No acute chest findings.

## 2022-10-13 ENCOUNTER — Encounter (HOSPITAL_COMMUNITY): Payer: Self-pay

## 2022-10-13 ENCOUNTER — Emergency Department (HOSPITAL_COMMUNITY): Payer: Self-pay

## 2022-10-13 ENCOUNTER — Emergency Department (HOSPITAL_COMMUNITY)
Admission: EM | Admit: 2022-10-13 | Discharge: 2022-10-13 | Discharge status: Against Medical Advice | Payer: Self-pay | Attending: Emergency Medicine | Admitting: Emergency Medicine

## 2022-10-13 ENCOUNTER — Other Ambulatory Visit: Payer: Self-pay

## 2022-10-13 DIAGNOSIS — R0789 Other chest pain: Secondary | ICD-10-CM | POA: Insufficient documentation

## 2022-10-13 DIAGNOSIS — Z5321 Procedure and treatment not carried out due to patient leaving prior to being seen by health care provider: Secondary | ICD-10-CM | POA: Insufficient documentation

## 2022-10-13 LAB — CBC
HCT: 45 % (ref 36.0–46.0)
Hemoglobin: 14.8 g/dL (ref 12.0–15.0)
MCH: 29.8 pg (ref 26.0–34.0)
MCHC: 32.9 g/dL (ref 30.0–36.0)
MCV: 90.5 fL (ref 80.0–100.0)
Platelets: 202 10*3/uL (ref 150–400)
RBC: 4.97 MIL/uL (ref 3.87–5.11)
RDW: 12.8 % (ref 11.5–15.5)
WBC: 5.8 10*3/uL (ref 4.0–10.5)
nRBC: 0 % (ref 0.0–0.2)

## 2022-10-13 LAB — I-STAT BETA HCG BLOOD, ED (MC, WL, AP ONLY): I-stat hCG, quantitative: <5 m[IU]/mL (ref <5)

## 2022-10-13 LAB — BASIC METABOLIC PANEL
Anion gap: 5 (ref 5–15)
BUN: 7 mg/dL (ref 6–20)
CO2: 28 mmol/L (ref 22–32)
Calcium: 9 mg/dL (ref 8.9–10.3)
Chloride: 103 mmol/L (ref 98–111)
Creatinine, Ser: 0.79 mg/dL (ref 0.44–1.00)
GFR, Estimated: >60 mL/min (ref >60)
Glucose, Bld: 111 mg/dL — ABNORMAL HIGH (ref 70–99)
Potassium: 4 mmol/L (ref 3.5–5.1)
Sodium: 136 mmol/L (ref 135–145)

## 2022-10-13 LAB — TROPONIN I (HIGH SENSITIVITY): Troponin I (High Sensitivity): 4 ng/L (ref <18)

## 2022-10-13 NOTE — ED Triage Notes (Signed)
Patient reports that she has had constant chest pain x 9 days. Patient states, "I thought it was reflux and I have been taking Gas-X, dulcolax, stool softeners, Philips MOM, Pepcid AC and Zantac with no relief." Patient states the tightness was worse today and pain worse when taking a deep breath. Patient denies any SOB.

## 2022-10-13 NOTE — ED Provider Triage Note (Signed)
Emergency Medicine Provider Triage Evaluation Note  ALEKSANDRA RABEN , a 46 y.o. female  was evaluated in triage.  Pt complains of chest pain x 2 weeks.thought it was GI, took antacids, laxatives without relief. C/o chest tightness and pain. Worse with  breathing. + smoking. + mother with 2x cabg at 35. ? Htn/ hld.  Pain is worse when lying down. Does not see a doctor regularly.  Review of Systems  Positive: cp Negative: fever  Physical Exam  BP (!) 158/104 (BP Location: Left Arm)   Pulse 67   Temp 98.1 F (36.7 C) (Oral)   Resp 18   Ht 5\' 4"  (1.626 m)   Wt 83.9 kg   LMP 09/16/2022   SpO2 100%   BMI 31.76 kg/m  Gen:   Awake, no distress   Resp:  Normal effort  MSK:   Moves extremities without difficulty  Other:    Medical Decision Making  Medically screening exam initiated at 9:20 AM.  Appropriate orders placed.  AMATULLAH CHRISTY was informed that the remainder of the evaluation will be completed by another provider, this initial triage assessment does not replace that evaluation, and the importance of remaining in the ED until their evaluation is complete.     Trisha Mangle, PA-C 10/13/22 [phone]

## 2022-10-17 ENCOUNTER — Other Ambulatory Visit: Payer: Self-pay

## 2022-10-17 ENCOUNTER — Emergency Department (HOSPITAL_COMMUNITY)
Admission: EM | Admit: 2022-10-17 | Discharge: 2022-10-18 | Disposition: A | Discharge status: Home / Self Care | Payer: Self-pay | Attending: Emergency Medicine | Admitting: Emergency Medicine

## 2022-10-17 ENCOUNTER — Emergency Department (HOSPITAL_COMMUNITY): Payer: Self-pay

## 2022-10-17 DIAGNOSIS — R7309 Other abnormal glucose: Secondary | ICD-10-CM | POA: Insufficient documentation

## 2022-10-17 DIAGNOSIS — F17210 Nicotine dependence, cigarettes, uncomplicated: Secondary | ICD-10-CM | POA: Insufficient documentation

## 2022-10-17 DIAGNOSIS — K8500 Idiopathic acute pancreatitis without necrosis or infection: Secondary | ICD-10-CM | POA: Insufficient documentation

## 2022-10-17 DIAGNOSIS — N83201 Unspecified ovarian cyst, right side: Secondary | ICD-10-CM | POA: Insufficient documentation

## 2022-10-17 LAB — CBC
HCT: 43.3 % (ref 36.0–46.0)
Hemoglobin: 15.1 g/dL — ABNORMAL HIGH (ref 12.0–15.0)
MCH: 30.8 pg (ref 26.0–34.0)
MCHC: 34.9 g/dL (ref 30.0–36.0)
MCV: 88.4 fL (ref 80.0–100.0)
Platelets: 214 10*3/uL (ref 150–400)
RBC: 4.9 MIL/uL (ref 3.87–5.11)
RDW: 12.5 % (ref 11.5–15.5)
WBC: 7 10*3/uL (ref 4.0–10.5)
nRBC: 0 % (ref 0.0–0.2)

## 2022-10-17 LAB — BASIC METABOLIC PANEL
Anion gap: 13 (ref 5–15)
BUN: 8 mg/dL (ref 6–20)
CO2: 21 mmol/L — ABNORMAL LOW (ref 22–32)
Calcium: 9.5 mg/dL (ref 8.9–10.3)
Chloride: 103 mmol/L (ref 98–111)
Creatinine, Ser: 0.91 mg/dL (ref 0.44–1.00)
GFR, Estimated: >60 mL/min (ref >60)
Glucose, Bld: 123 mg/dL — ABNORMAL HIGH (ref 70–99)
Potassium: 3.6 mmol/L (ref 3.5–5.1)
Sodium: 137 mmol/L (ref 135–145)

## 2022-10-17 LAB — I-STAT BETA HCG BLOOD, ED (MC, WL, AP ONLY): I-stat hCG, quantitative: <5 m[IU]/mL (ref <5)

## 2022-10-17 LAB — TROPONIN I (HIGH SENSITIVITY): Troponin I (High Sensitivity): 5 ng/L (ref <18)

## 2022-10-17 NOTE — ED Provider Triage Note (Signed)
Emergency Medicine Provider Triage Evaluation Note  [name] , a 46 y.o. female  was evaluated in triage.  Pt complains of chest pain that feels like it is pulling from left abdomen, left breast, associated with epigastric pain.  She endorses no nausea, vomiting today, did have 1 episode of emesis on Tuesday.  She reports that she has been taking acid reflux medications with no significant relief.  She reports that the pain feels like a electric shock in nature.  She denies feeling like pressure.  She denies history of high blood pressure, high cholesterol, previous stroke, previous ACS, she does endorse tobacco use, reporting half a pack of cigarettes per day.  Review of Systems  Positive: Chest pain, pain with inspiration Negative: Nausea, vomiting, shortness of breath  Physical Exam  BP (!) 172/106   Pulse 84   Temp 98.5 F (36.9 C)   Resp 19   LMP 09/16/2022   SpO2 100%  Gen:   Awake, no distress   Resp:  Normal effort  MSK:   Moves extremities without difficulty  Other:  Ttp of chest wall, as well as epigastric, no rebound, rigidity, guarding  Medical Decision Making  Medically screening exam initiated at 11:04 PM.  Appropriate orders placed.  JENDAYA GOSSETT was informed that the remainder of the evaluation will be completed by another provider, this initial triage assessment does not replace that evaluation, and the importance of remaining in the ED until their evaluation is complete.  Workup initiated   Olene Floss, [place] 10/17/22 2311

## 2022-10-17 NOTE — ED Triage Notes (Signed)
Pt with constant chest pain, "pulling" from left abdomen to left breast . Has been taking OTC meds for reflux with no relief.  Went to [facility] on Monday but left prior to being seen d/t wait.   No fever, chills, n/v/d, or shob

## 2022-10-18 ENCOUNTER — Emergency Department (HOSPITAL_COMMUNITY): Payer: Self-pay

## 2022-10-18 LAB — HEPATIC FUNCTION PANEL
ALT: 15 U/L (ref 0–44)
AST: 33 U/L (ref 15–41)
Albumin: 3.7 g/dL (ref 3.5–5.0)
Alkaline Phosphatase: 67 U/L (ref 38–126)
Bilirubin, Direct: 0.1 mg/dL (ref 0.0–0.2)
Indirect Bilirubin: 0.2 mg/dL — ABNORMAL LOW (ref 0.3–0.9)
Total Bilirubin: 0.3 mg/dL (ref 0.3–1.2)
Total Protein: 7.8 g/dL (ref 6.5–8.1)

## 2022-10-18 LAB — LIPASE, BLOOD: Lipase: 135 U/L — ABNORMAL HIGH (ref 11–51)

## 2022-10-18 LAB — TROPONIN I (HIGH SENSITIVITY): Troponin I (High Sensitivity): 6 ng/L (ref <18)

## 2022-10-18 MED ORDER — PANTOPRAZOLE SODIUM 40 MG PO TBEC
40.0000 mg | DELAYED_RELEASE_TABLET | Freq: Once | ORAL | Status: AC
  Administered 2022-10-18: 40 mg via ORAL
  Filled 2022-10-18: qty 1

## 2022-10-18 MED ORDER — PANTOPRAZOLE SODIUM 40 MG PO TBEC: 40.0000 mg | DELAYED_RELEASE_TABLET | Freq: Every day | ORAL | 0 refills | Status: AC

## 2022-10-18 MED ORDER — IOHEXOL 350 MG/ML SOLN
75.0000 mL | Freq: Once | INTRAVENOUS | Status: AC | PRN
  Administered 2022-10-18: 75 mL via INTRAVENOUS

## 2022-10-18 MED ORDER — ONDANSETRON 8 MG PO TBDP: 8.0000 mg | ORAL_TABLET | Freq: Three times a day (TID) | ORAL | 0 refills | Status: AC | PRN

## 2022-10-18 MED ORDER — MORPHINE SULFATE (PF) 4 MG/ML IV SOLN
4.0000 mg | Freq: Once | INTRAVENOUS | Status: AC
  Administered 2022-10-18: 4 mg via INTRAVENOUS
  Filled 2022-10-18: qty 1

## 2022-10-18 MED ORDER — OXYCODONE HCL 5 MG PO TABS: 5.0000 mg | ORAL_TABLET | ORAL | 0 refills | Status: AC | PRN

## 2022-10-18 NOTE — Discharge Instructions (Addendum)
Your CT scan showed you have an inflamed pancreas as well as a cyst on the right ovary.  The radiologist has recommended that to have a nonemergent ultrasound to further evaluate the cyst.  I have referred you to the women's outpatient clinic to set this up.  Do not drink any alcohol.  It can cause your pancreas to become inflamed.  You may take acetaminophen for less severe pain, combine with oxycodone for more severe pain.  Return to the emergency department if symptoms or not being adequately controlled at home.

## 2022-10-18 NOTE — ED Provider Notes (Signed)
[facility] EMERGENCY DEPARTMENT Provider Note   CSN: [id number] Arrival date & time: 10/17/22  2237     History  Chief Complaint  Patient presents with   Chest Pain    [name] is a 46 y.o. female.  The history is provided by the patient.  Chest Pain She has a history of asthma and complains of left-sided abdominal pain for about the last week.  Pain waxes and wanes and does radiate up into the chest.  She denies nausea, vomiting, diarrhea.  In fact, she has been constipated and has tried taking antacids and laxatives without relief.  She did have a bowel movement earlier today and did note some temporary improvement in her pain.  Pain also improves briefly when she belches.  She denies dyspnea and diaphoresis.  She has tried taking an acids, famotidine, stool softeners, laxatives.  She is a cigarette smoker but denies history of diabetes, hypertension, hyperlipidemia.  There is a strong family history of premature coronary atherosclerosis.   Home Medications Prior to Admission medications   Medication Sig Start Date End Date Taking? Authorizing Provider  clindamycin (CLEOCIN) 150 MG capsule Take 3 capsules (450 mg total) by mouth 3 (three) times daily. 10/15/19   Muthersbaugh, Dahlia Client, PA-C  ibuprofen (ADVIL,MOTRIN) 200 MG tablet Take 400 mg by mouth every 6 (six) hours as needed for headache, mild pain or moderate pain.    [provider]  meloxicam (MOBIC) 15 MG tablet Take 1 tablet (15 mg total) by mouth daily. TAKE WITH MEALS 04/27/17   Street, [place], PA-C  methocarbamol (ROBAXIN) 500 MG tablet Take 1 tablet (500 mg total) by mouth 2 (two) times daily. 11/22/16   Muthersbaugh, Dahlia Client, PA-C  methylPREDNISolone (MEDROL DOSEPAK) 4 MG TBPK tablet Take as directed 11/20/19   Dartha Lodge, PA-C  naproxen (NAPROSYN) 500 MG tablet Take 1 tablet (500 mg total) by mouth 2 (two) times daily with a meal. 11/22/16   Muthersbaugh, Dahlia Client, PA-C       Allergies    Penicillins    Review of Systems   Review of Systems  Cardiovascular:  Positive for chest pain.  All other systems reviewed and are negative.   Physical Exam Updated Vital Signs BP (!) 172/106   Pulse 84   Temp 98.5 F (36.9 C)   Resp 19   LMP 09/16/2022   SpO2 100%  Physical Exam Vitals and nursing note reviewed.   46 year old female, resting comfortably and in no acute distress. Vital signs are significant for elevated blood pressure. Oxygen saturation is 100%, which is normal. Head is normocephalic and atraumatic. PERRLA, EOMI. Oropharynx is clear. Neck is nontender and supple without adenopathy or JVD. Back is nontender and there is no CVA tenderness. Lungs are clear without rales, wheezes, or rhonchi. Chest has mild tenderness in the left anterior chest wall.  There is no crepitus. Heart has regular rate and rhythm without murmur. Abdomen is soft, flat, with mild to moderate tenderness throughout the left side of the abdomen.  There is no rebound or guarding. Extremities have no cyanosis or edema, full range of motion is present. Skin is warm and dry without rash. Neurologic: Mental status is normal, cranial nerves are intact, moves all extremities equally.  ED Results / Procedures / Treatments   Labs (all labs ordered are listed, but only abnormal results are displayed) Labs Reviewed  BASIC METABOLIC PANEL - Abnormal; Notable for the following components:  Result Value   CO2 21 (*)    Glucose, Bld 123 (*)    All other components within normal limits  CBC - Abnormal; Notable for the following components:   Hemoglobin 15.1 (*)    All other components within normal limits  I-STAT BETA HCG BLOOD, ED ([facility], [facility], [facility] ONLY)  TROPONIN I (HIGH SENSITIVITY)  TROPONIN I (HIGH SENSITIVITY)    EKG EKG Interpretation  Date/Time:  Friday October 17 2022 22:39:20 EST Ventricular Rate:  85 PR Interval:  136 QRS Duration: 66 QT Interval:  388 QTC  Calculation: 461 R Axis:   87 Text Interpretation: Normal sinus rhythm with sinus arrhythmia ST & T wave abnormality, consider inferior ischemia Abnormal ECG When compared with ECG of 13-Oct-2022 09:03, No significant change was found Confirmed by Dione Booze ([id number]) on 10/18/2022 12:37:33 AM  Radiology DG Chest 2 View  Result Date: 10/17/2022 CLINICAL DATA:  Chest pain EXAM: CHEST - 2 VIEW COMPARISON:  Radiographs 10/13/2022 FINDINGS: The heart size and mediastinal contours are within normal limits. Both lungs are clear. The visualized skeletal structures are unremarkable. IMPRESSION: No active cardiopulmonary disease. Electronically Signed   By: Minerva Fester M.D.   On: 10/17/2022 23:31    Procedures Procedures    Medications Ordered in ED Medications - No data to display  ED Course/ Medical Decision Making/ A&P                           Medical Decision Making Amount and/or Complexity of Data Reviewed Labs: ordered. Radiology: ordered.  Risk Prescription drug management.   Left-sided abdominal pain.  Chest pain appears to be radiation of pain from the abdomen.  Differential diagnosis is broad and includes, but is not limited to, ACS, GERD, peptic ulcer disease, diverticulitis, irritable bowel syndrome.  I have reviewed and interpreted her electrocardiogram, and my interpretation is nonspecific ST and T changes which are unchanged from prior.  Chest x-ray shows no active cardiopulmonary disease.  I have independently viewed the images, and agree with radiologist's interpretation.  I have reviewed and interpreted her laboratory test, and my interpretation is mild elevation of random glucose consistent with prediabetes, normal CBC, normal troponin x2.  I have ordered additional laboratory testing of hepatic function panel, lipase and I have ordered a CT of abdomen and pelvis.  I have reviewed and interpreted her additional laboratory tests, and my interpretation is elevated lipase  consistent with pancreatitis.  CT scan of abdomen and pelvis also shows evidence of pancreatitis and incidental finding of a 3.1 cm complex right ovarian cyst with recommendation for outpatient ultrasound for follow-up.  I have independently viewed the images, and agree with radiologist's interpretation.  I have ordered a dose of pantoprazole and morphine.  I considered hospital admission for IV fluids and narcotic analgesics, but patient is tolerating fluids and feels that she would be able to manage symptoms at home.  I feel this is reasonable.  I have sent prescriptions for pantoprazole, ondansetron oral dissolving tablet, and oxycodone.  I have put in referrals to gastroenterology and gynecology.  Patient advised to return if symptoms or not being adequately managed at home.  Final Clinical Impression(s) / ED Diagnoses Final diagnoses:  Idiopathic acute pancreatitis without infection or necrosis  Elevated random blood glucose level  Right ovarian cyst    Rx / DC Orders ED Discharge Orders          Ordered    pantoprazole (PROTONIX)  40 MG tablet  Daily        10/18/22 0606    ondansetron (ZOFRAN-ODT) 8 MG disintegrating tablet  Every 8 hours PRN        10/18/22 0606    oxyCODONE (ROXICODONE) 5 MG immediate release tablet  Every 4 hours PRN        10/18/22 0606              Dione Booze, MD 10/18/22 [phone]

## 2022-10-24 ENCOUNTER — Encounter: Payer: Self-pay | Admitting: Radiology

## 2023-08-05 ENCOUNTER — Ambulatory Visit (HOSPITAL_COMMUNITY): Payer: Self-pay

## 2023-10-30 ENCOUNTER — Ambulatory Visit (HOSPITAL_COMMUNITY): Payer: Self-pay

## 2024-02-01 ENCOUNTER — Ambulatory Visit: Payer: Self-pay

## 2024-06-27 ENCOUNTER — Ambulatory Visit: Payer: Self-pay

## 2024-06-28 ENCOUNTER — Ambulatory Visit: Payer: Self-pay
